# Patient Record
Sex: Female | Born: 1990 | Hispanic: No | Marital: Single | Smoking: Never smoker
Health system: Southern US, Community
[De-identification: ages and names within clinical notes are randomized; demographics above are authoritative.]

## PROBLEM LIST (undated history)

## (undated) ENCOUNTER — Inpatient Hospital Stay (HOSPITAL_COMMUNITY)

## (undated) ENCOUNTER — Inpatient Hospital Stay (HOSPITAL_COMMUNITY): Payer: Self-pay

## (undated) DIAGNOSIS — F319 Bipolar disorder, unspecified: Secondary | ICD-10-CM

## (undated) DIAGNOSIS — T8859XA Other complications of anesthesia, initial encounter: Secondary | ICD-10-CM

## (undated) DIAGNOSIS — K219 Gastro-esophageal reflux disease without esophagitis: Secondary | ICD-10-CM

## (undated) DIAGNOSIS — N83209 Unspecified ovarian cyst, unspecified side: Secondary | ICD-10-CM

## (undated) DIAGNOSIS — D649 Anemia, unspecified: Secondary | ICD-10-CM

## (undated) DIAGNOSIS — H3530 Unspecified macular degeneration: Secondary | ICD-10-CM

## (undated) DIAGNOSIS — F419 Anxiety disorder, unspecified: Secondary | ICD-10-CM

## (undated) DIAGNOSIS — R87629 Unspecified abnormal cytological findings in specimens from vagina: Secondary | ICD-10-CM

## (undated) DIAGNOSIS — B009 Herpesviral infection, unspecified: Secondary | ICD-10-CM

## (undated) DIAGNOSIS — K449 Diaphragmatic hernia without obstruction or gangrene: Secondary | ICD-10-CM

## (undated) HISTORY — PX: TYMPANOSTOMY TUBE PLACEMENT: SHX32

## (undated) HISTORY — DX: Unspecified macular degeneration: H35.30

## (undated) HISTORY — PX: ESOPHAGOGASTRODUODENOSCOPY ENDOSCOPY: SHX5814

## (undated) HISTORY — PX: WISDOM TOOTH EXTRACTION: SHX21

---

## 2015-03-11 ENCOUNTER — Encounter (HOSPITAL_COMMUNITY): Payer: Self-pay

## 2015-03-11 ENCOUNTER — Inpatient Hospital Stay (HOSPITAL_COMMUNITY)
Admission: AD | Admit: 2015-03-11 | Discharge: 2015-03-11 | Disposition: A | Discharge status: Home / Self Care | Payer: Federal, State, Local not specified - PPO | Source: Ambulatory Visit | Attending: Obstetrics and Gynecology | Admitting: Obstetrics and Gynecology

## 2015-03-11 DIAGNOSIS — L299 Pruritus, unspecified: Secondary | ICD-10-CM | POA: Diagnosis present

## 2015-03-11 DIAGNOSIS — L259 Unspecified contact dermatitis, unspecified cause: Secondary | ICD-10-CM | POA: Diagnosis not present

## 2015-03-11 HISTORY — DX: Bipolar disorder, unspecified: F31.9

## 2015-03-11 HISTORY — DX: Gastro-esophageal reflux disease without esophagitis: K21.9

## 2015-03-11 HISTORY — DX: Anemia, unspecified: D64.9

## 2015-03-11 HISTORY — DX: Herpesviral infection, unspecified: B00.9

## 2015-03-11 HISTORY — DX: Anxiety disorder, unspecified: F41.9

## 2015-03-11 LAB — URINALYSIS, ROUTINE W REFLEX MICROSCOPIC
BILIRUBIN URINE: NEGATIVE
GLUCOSE, UA: NEGATIVE mg/dL
Hgb urine dipstick: NEGATIVE
KETONES UR: NEGATIVE mg/dL
Leukocytes, UA: NEGATIVE
Nitrite: NEGATIVE
PH: 7.5 (ref 5.0–8.0)
Protein, ur: 30 mg/dL — AB
Specific Gravity, Urine: 1.025 (ref 1.005–1.030)
Urobilinogen, UA: 0.2 mg/dL (ref 0.0–1.0)

## 2015-03-11 LAB — URINE MICROSCOPIC-ADD ON

## 2015-03-11 LAB — POCT PREGNANCY, URINE: Preg Test, Ur: NEGATIVE

## 2015-03-11 NOTE — MAU Provider Note (Signed)
Chief Complaint: Pruritis   First Provider Initiated Contact with Patient 03/11/15 1131      SUBJECTIVE HPI: Megan Owens is a 24 y.o. G0 who presents to maternity admissions reporting generalized itching starting 1 week ago.  She has no rash but is itching all over her body.  She reports adding a new detergent to her laundry 3 weeks ago, no other changes in soaps/perfumes, etc. She saw Urgent Care this week and reported vaginal itching to them and was tested for STDs.  She reports they did not do much to evaluate her itching anywhere else but she told them she was concerned about STDs so they focused on that.  She tried Benadryl but it not relieve the itching.  She denies vaginal bleeding, urinary symptoms, h/a, dizziness, n/v, or fever/chills.    Past Medical History  Diagnosis Date  . Anxiety   . Anemia   . Acid reflux   . HSV infection   . Bipolar 1 disorder    Past Surgical History  Procedure Laterality Date  . Wisdom tooth extraction    . Tympanostomy tube placement     History   Social History  . Marital Status: Single    Spouse Name: N/A  . Number of Children: N/A  . Years of Education: N/A   Occupational History  . Not on file.   Social History Main Topics  . Smoking status: Never Smoker   . Smokeless tobacco: Never Used  . Alcohol Use: No  . Drug Use: No  . Sexual Activity: Yes    Birth Control/ Protection: Pill   Other Topics Concern  . Not on file   Social History Narrative  . No narrative on file   No current facility-administered medications on file prior to encounter.   No current outpatient prescriptions on file prior to encounter.   No Known Allergies  ROS: Pertinent items in HPI  OBJECTIVE Blood pressure 127/78, pulse 78, temperature 97.8 F (36.6 C), temperature source Oral, resp. rate 16, height 5\' 5"  (1.651 m), weight 62.143 kg (137 lb). GENERAL: Well-developed, well-nourished female in no acute distress.  HEENT:  Normocephalic HEART: normal rate RESP: normal effort ABDOMEN: Soft, non-tender EXTREMITIES: Nontender, no edema NEURO: Alert and oriented Skin:  No visible rash on extremities, trunk, back, face, or genitals.  Pruritis with some erythema from scratching on BUE and abdomen. Pelvic exam: Deferred  LAB RESULTS Results for orders placed or performed during the hospital encounter of 03/11/15 (from the past 24 hour(s))  Urinalysis, Routine w reflex microscopic     Status: Abnormal   Collection Time: 03/11/15 10:40 AM  Result Value Ref Range   Color, Urine YELLOW YELLOW   APPearance CLEAR CLEAR   Specific Gravity, Urine 1.025 1.005 - 1.030   pH 7.5 5.0 - 8.0   Glucose, UA NEGATIVE NEGATIVE mg/dL   Hgb urine dipstick NEGATIVE NEGATIVE   Bilirubin Urine NEGATIVE NEGATIVE   Ketones, ur NEGATIVE NEGATIVE mg/dL   Protein, ur 30 (A) NEGATIVE mg/dL   Urobilinogen, UA 0.2 0.0 - 1.0 mg/dL   Nitrite NEGATIVE NEGATIVE   Leukocytes, UA NEGATIVE NEGATIVE  Urine microscopic-add on     Status: None   Collection Time: 03/11/15 10:40 AM  Result Value Ref Range   Squamous Epithelial / LPF RARE RARE   Bacteria, UA RARE RARE   Urine-Other MUCOUS PRESENT   Pregnancy, urine POC     Status: None   Collection Time: 03/11/15 10:47 AM  Result Value Ref  Range   Preg Test, Ur NEGATIVE NEGATIVE    IMAGING No results found.  ASSESSMENT 1. Contact dermatitis   2. Generalized pruritus     PLAN Consult Dr Claiborne Billings Discharge home Continue PO Benadryl Referral to Dermatology. Pt reports she has a regular dermatologist for eczema and will contact him today. If symptoms worsen or persist prior to dermatology visit, recommend Urgent Care or [facility]   Medication List    TAKE these medications        BIOTIN PO  Take 1 tablet by mouth daily.     Dexlansoprazole 30 MG capsule  Take 30 mg by mouth daily.     glycopyrrolate 1 MG tablet  Commonly known as:  ROBINUL  Take 2 mg by mouth daily.      Lurasidone HCl 20 MG Tabs  Take 1 tablet by mouth daily.     Minocycline HCl 65 MG Tb24  Take 1 tablet by mouth daily.     Norethin Ace-Eth Estrad-FE 1-20 MG-MCG(24) Chew  Chew 1 tablet by mouth daily.     PROBIOTIC DAILY PO  Take 1 tablet by mouth daily.     valACYclovir 500 MG tablet  Commonly known as:  VALTREX  Take 500 mg by mouth daily.       Follow-up Information    Please follow up.   Why:  With dermatology if symptoms persist.  [facility] [phone]      Follow up with [facility].   Why:  If symptoms persist or worsen   Contact information:   [address] [place] [address]       Sharen Counter Certified Nurse-Midwife 03/11/2015  4:45 PM

## 2015-03-11 NOTE — Discharge Instructions (Signed)

## 2015-03-11 NOTE — MAU Note (Signed)
Pt states itching all over since Monday. Went to UC and had cultures obtained, which have been negative so far. Took benadryl and oatmeal bath and nothing helped. Has had white vaginal discharge that is normal, non-odorous.

## 2015-03-18 ENCOUNTER — Inpatient Hospital Stay (HOSPITAL_COMMUNITY)
Admission: AD | Admit: 2015-03-18 | Discharge: 2015-03-18 | Disposition: A | Discharge status: Home / Self Care | Payer: Federal, State, Local not specified - PPO | Source: Ambulatory Visit | Attending: Obstetrics and Gynecology | Admitting: Obstetrics and Gynecology

## 2015-03-18 ENCOUNTER — Encounter (HOSPITAL_COMMUNITY): Payer: Self-pay | Admitting: *Deleted

## 2015-03-18 DIAGNOSIS — R102 Pelvic and perineal pain: Secondary | ICD-10-CM | POA: Insufficient documentation

## 2015-03-18 DIAGNOSIS — B373 Candidiasis of vulva and vagina: Secondary | ICD-10-CM | POA: Diagnosis not present

## 2015-03-18 DIAGNOSIS — B3731 Acute candidiasis of vulva and vagina: Secondary | ICD-10-CM

## 2015-03-18 LAB — URINE MICROSCOPIC-ADD ON

## 2015-03-18 LAB — WET PREP, GENITAL
Clue Cells Wet Prep HPF POC: NONE SEEN
Trich, Wet Prep: NONE SEEN
WBC, Wet Prep HPF POC: NONE SEEN
Yeast Wet Prep HPF POC: NONE SEEN

## 2015-03-18 LAB — URINALYSIS, ROUTINE W REFLEX MICROSCOPIC
Bilirubin Urine: NEGATIVE
Glucose, UA: NEGATIVE mg/dL
KETONES UR: NEGATIVE mg/dL
LEUKOCYTES UA: NEGATIVE
NITRITE: NEGATIVE
Protein, ur: 100 mg/dL — AB
Specific Gravity, Urine: >1.03 — ABNORMAL HIGH (ref 1.005–1.030)
Urobilinogen, UA: 0.2 mg/dL (ref 0.0–1.0)
pH: 6 (ref 5.0–8.0)

## 2015-03-18 LAB — POCT PREGNANCY, URINE: Preg Test, Ur: NEGATIVE

## 2015-03-18 MED ORDER — FLUCONAZOLE 150 MG PO TABS: 150.0000 mg | ORAL_TABLET | Freq: Every day | ORAL | Status: DC

## 2015-03-18 NOTE — Discharge Instructions (Signed)
Abdominal Pain, Women °Abdominal (stomach, pelvic, or belly) pain can be caused by many things. It is important to tell your doctor: °· The location of the pain. °· Does it come and go or is it present all the time? °· Are there things that start the pain (eating certain foods, exercise)? °· Are there other symptoms associated with the pain (fever, nausea, vomiting, diarrhea)? °All of this is helpful to know when trying to find the cause of the pain. °CAUSES  °· Stomach: virus or bacteria infection, or ulcer. °· Intestine: appendicitis (inflamed appendix), regional ileitis (Crohn's disease), ulcerative colitis (inflamed colon), irritable bowel syndrome, diverticulitis (inflamed diverticulum of the colon), or cancer of the stomach or intestine. °· Gallbladder disease or stones in the gallbladder. °· Kidney disease, kidney stones, or infection. °· Pancreas infection or cancer. °· Fibromyalgia (pain disorder). °· Diseases of the female organs: °¨ Uterus: fibroid (non-cancerous) tumors or infection. °¨ Fallopian tubes: infection or tubal pregnancy. °¨ Ovary: cysts or tumors. °¨ Pelvic adhesions (scar tissue). °¨ Endometriosis (uterus lining tissue growing in the pelvis and on the pelvic organs). °¨ Pelvic congestion syndrome (female organs filling up with blood just before the menstrual period). °¨ Pain with the menstrual period. °¨ Pain with ovulation (producing an egg). °¨ Pain with an IUD (intrauterine device, birth control) in the uterus. °¨ Cancer of the female organs. °· Functional pain (pain not caused by a disease, may improve without treatment). °· Psychological pain. °· Depression. °DIAGNOSIS  °Your doctor will decide the seriousness of your pain by doing an examination. °· Blood tests. °· X-rays. °· Ultrasound. °· CT scan (computed tomography, special type of X-ray). °· MRI (magnetic resonance imaging). °· Cultures, for infection. °· Barium enema (dye inserted in the large intestine, to better view it with  X-rays). °· Colonoscopy (looking in intestine with a lighted tube). °· Laparoscopy (minor surgery, looking in abdomen with a lighted tube). °· Major abdominal exploratory surgery (looking in abdomen with a large incision). °TREATMENT  °The treatment will depend on the cause of the pain.  °· Many cases can be observed and treated at home. °· Over-the-counter medicines recommended by your caregiver. °· Prescription medicine. °· Antibiotics, for infection. °· Birth control pills, for painful periods or for ovulation pain. °· Hormone treatment, for endometriosis. °· Nerve blocking injections. °· Physical therapy. °· Antidepressants. °· Counseling with a psychologist or psychiatrist. °· Minor or major surgery. °HOME CARE INSTRUCTIONS  °· Do not take laxatives, unless directed by your caregiver. °· Take over-the-counter pain medicine only if ordered by your caregiver. Do not take aspirin because it can cause an upset stomach or bleeding. °· Try a clear liquid diet (broth or water) as ordered by your caregiver. Slowly move to a bland diet, as tolerated, if the pain is related to the stomach or intestine. °· Have a thermometer and take your temperature several times a day, and record it. °· Bed rest and sleep, if it helps the pain. °· Avoid sexual intercourse, if it causes pain. °· Avoid stressful situations. °· Keep your follow-up appointments and tests, as your caregiver orders. °· If the pain does not go away with medicine or surgery, you may try: °¨ Acupuncture. °¨ Relaxation exercises (yoga, meditation). °¨ Group therapy. °¨ Counseling. °SEEK MEDICAL CARE IF:  °· You notice certain foods cause stomach pain. °· Your home care treatment is not helping your pain. °· You need stronger pain medicine. °· You want your IUD removed. °· You feel faint or   lightheaded. °· You develop nausea and vomiting. °· You develop a rash. °· You are having side effects or an allergy to your medicine. °SEEK IMMEDIATE MEDICAL CARE IF:  °· Your  pain does not go away or gets worse. °· You have a fever. °· Your pain is felt only in portions of the abdomen. The right side could possibly be appendicitis. The left lower portion of the abdomen could be colitis or diverticulitis. °· You are passing blood in your stools (bright red or black tarry stools, with or without vomiting). °· You have blood in your urine. °· You develop chills, with or without a fever. °· You pass out. °MAKE SURE YOU:  °· Understand these instructions. °· Will watch your condition. °· Will get help right away if you are not doing well or get worse. °Document Released: 09/23/2007 Document Revised: 04/12/2014 Document Reviewed: 10/13/2009 °Exit[employer]® Patient Information ©2015 [employer]. This information is not intended to replace advice given to you by your health care provider. Make sure you discuss any questions you have with your health care provider. ° °

## 2015-03-18 NOTE — MAU Provider Note (Signed)
History     CSN: [id number]  Arrival date and time: 03/18/15 1526   First Provider Initiated Contact with Patient 03/18/15 1600      Chief Complaint  Patient presents with  . Pelvic Pain   HPI  [name] is a 24 y.o. G0P0000 who presents to MAU today with complaint of pelvic pain x 2 weeks. The patient was in the office yesterday and had a pelvic exam. She states that no one told her the results of the swab that was taken. Patient also states that she was seen at Urgent Care last week for the same complaint and had a negative GC/Chlamydia then. She states pain now is 5/10, cramping in the lower abdomen. She also complains of nausea without vomiting, diarrhea or constipation. She denies vaginal discharge, bleeding or fever. She states LMP 03/10/15 was normal. She endorses light regular periods since starting OCPs.   OB History    Gravida Para Term Preterm AB TAB SAB Ectopic Multiple Living   0 0 0 0 0 0 0 0 0 0       Past Medical History  Diagnosis Date  . Anxiety   . Anemia   . Acid reflux   . HSV infection   . Bipolar 1 disorder     Past Surgical History  Procedure Laterality Date  . Wisdom tooth extraction    . Tympanostomy tube placement      History reviewed. No pertinent family history.  History  Substance Use Topics  . Smoking status: Never Smoker   . Smokeless tobacco: Never Used  . Alcohol Use: No    Allergies: No Known Allergies  Prescriptions prior to admission  Medication Sig Dispense Refill Last Dose  . BIOTIN PO Take 1 tablet by mouth daily.   03/18/2015 at Unknown time  . Dexlansoprazole 30 MG capsule Take 30 mg by mouth daily.   03/18/2015 at Unknown time  . diphenhydrAMINE (BENADRYL) 25 MG tablet Take 25 mg by mouth every 6 (six) hours as needed for itching.   Past Week at Unknown time  . glycopyrrolate (ROBINUL) 1 MG tablet Take 2 mg by mouth daily.   Past Week at Unknown time  . Lurasidone HCl 20 MG TABS Take 1 tablet by mouth daily.    Past Week at Unknown time  . Minocycline HCl 65 MG TB24 Take 1 tablet by mouth daily.   Past Week at Unknown time  . Norethin Ace-Eth Estrad-FE 1-20 MG-MCG(24) CHEW Chew 1 tablet by mouth daily.   03/18/2015 at Unknown time  . Probiotic Product (PROBIOTIC DAILY PO) Take 1 tablet by mouth daily.   03/17/2015 at Unknown time  . valACYclovir (VALTREX) 500 MG tablet Take 500 mg by mouth daily.   03/18/2015 at Unknown time  . Vitamin D, Ergocalciferol, (DRISDOL) 50000 UNITS CAPS capsule Take 50,000 Units by mouth every 7 (seven) days.   Past Month at Unknown time    Review of Systems  Constitutional: Negative for fever and malaise/fatigue.  Gastrointestinal: Positive for nausea and abdominal pain. Negative for vomiting, diarrhea and constipation.  Genitourinary: Negative for dysuria, urgency and frequency.       Neg - vaginal bleeding, discharge   Physical Exam   Blood pressure 122/80, pulse 85, temperature 99.1 F (37.3 C), resp. rate 18, last menstrual period 03/10/2015.  Physical Exam  Constitutional: She is oriented to person, place, and time. She appears well-developed and well-nourished. No distress.  HENT:  Head: Normocephalic.  Cardiovascular: Normal rate.  Respiratory: Effort normal.  GI: Soft. Bowel sounds are normal. She exhibits no distension and no mass. There is tenderness (mild tenderness to palpation of the RLQ). There is no rebound and no guarding.  Genitourinary: Uterus is not enlarged and not tender. Right adnexum displays tenderness (mild). Right adnexum displays no mass. Left adnexum displays tenderness (mild). Left adnexum displays no mass. No bleeding in the vagina. Vaginal discharge (small amount of clumpy, white discharge noted) found.  Neurological: She is alert and oriented to person, place, and time.  Skin: Skin is warm and dry. No erythema.  Psychiatric: She has a normal mood and affect.   Results for orders placed or performed during the hospital encounter of  03/18/15 (from the past 24 hour(s))  Urinalysis, Routine w reflex microscopic     Status: Abnormal   Collection Time: 03/18/15  3:40 PM  Result Value Ref Range   Color, Urine YELLOW YELLOW   APPearance CLEAR CLEAR   Specific Gravity, Urine >1.030 (H) 1.005 - 1.030   pH 6.0 5.0 - 8.0   Glucose, UA NEGATIVE NEGATIVE mg/dL   Hgb urine dipstick SMALL (A) NEGATIVE   Bilirubin Urine NEGATIVE NEGATIVE   Ketones, ur NEGATIVE NEGATIVE mg/dL   Protein, ur 161 (A) NEGATIVE mg/dL   Urobilinogen, UA 0.2 0.0 - 1.0 mg/dL   Nitrite NEGATIVE NEGATIVE   Leukocytes, UA NEGATIVE NEGATIVE  Urine microscopic-add on     Status: Abnormal   Collection Time: 03/18/15  3:40 PM  Result Value Ref Range   Squamous Epithelial / LPF FEW (A) RARE   RBC / HPF 0-2 <3 RBC/hpf  Pregnancy, urine POC     Status: None   Collection Time: 03/18/15  4:12 PM  Result Value Ref Range   Preg Test, Ur NEGATIVE NEGATIVE  Wet prep, genital     Status: None   Collection Time: 03/18/15  5:20 PM  Result Value Ref Range   Yeast Wet Prep HPF POC NONE SEEN NONE SEEN   Trich, Wet Prep NONE SEEN NONE SEEN   Clue Cells Wet Prep HPF POC NONE SEEN NONE SEEN   WBC, Wet Prep HPF POC NONE SEEN NONE SEEN    MAU Course  Procedures None  MDM UPT  - negative UA and wet prep today Discussed patient with Dr. Henderson Cloud. She agrees with plan to perform pelvic exam and have patient follow-up in the office for [place] if needed Given minimal pain on exam and no masses noted will defer [place] to be performed outpatient  Assessment and Plan  A: Pelvic pain Yeast vaginitis, clinical  P: Discharge home Rx for Diflucan sent to patient's pharmacy Patient advised to take Ibuprofen PRN for pain Patient encouraged to follow-up in the office as scheduled for pelvic pain Patient may return to MAU as needed or if her condition were to change or worsen   Marny Lowenstein, PA-C  03/18/2015, 6:01 PM

## 2015-03-18 NOTE — MAU Note (Signed)
Pt reports having lower abd/pelvic pain sharp shooting pain x1-2 weeks. Went to ob yesterday and they schedualed her for an u/s in 2 weeks. Pt said she could not wait that long .

## 2015-03-31 ENCOUNTER — Other Ambulatory Visit: Payer: Self-pay | Admitting: Obstetrics and Gynecology

## 2015-03-31 DIAGNOSIS — R102 Pelvic and perineal pain: Secondary | ICD-10-CM

## 2015-04-04 ENCOUNTER — Ambulatory Visit
Admission: RE | Admit: 2015-04-04 | Discharge: 2015-04-04 | Disposition: A | Discharge status: Home / Self Care | Payer: Federal, State, Local not specified - PPO | Source: Ambulatory Visit | Attending: Obstetrics and Gynecology | Admitting: Obstetrics and Gynecology

## 2015-04-04 DIAGNOSIS — R102 Pelvic and perineal pain: Secondary | ICD-10-CM

## 2015-06-13 ENCOUNTER — Encounter (HOSPITAL_COMMUNITY): Payer: Self-pay | Admitting: Emergency Medicine

## 2015-06-13 ENCOUNTER — Emergency Department (HOSPITAL_COMMUNITY)
Admission: EM | Admit: 2015-06-13 | Discharge: 2015-06-13 | Disposition: A | Discharge status: Home / Self Care | Payer: Federal, State, Local not specified - PPO | Attending: Emergency Medicine | Admitting: Emergency Medicine

## 2015-06-13 DIAGNOSIS — M79601 Pain in right arm: Secondary | ICD-10-CM | POA: Diagnosis not present

## 2015-06-13 DIAGNOSIS — Z8619 Personal history of other infectious and parasitic diseases: Secondary | ICD-10-CM | POA: Diagnosis not present

## 2015-06-13 DIAGNOSIS — K219 Gastro-esophageal reflux disease without esophagitis: Secondary | ICD-10-CM | POA: Insufficient documentation

## 2015-06-13 DIAGNOSIS — Z79899 Other long term (current) drug therapy: Secondary | ICD-10-CM | POA: Diagnosis not present

## 2015-06-13 DIAGNOSIS — Z8659 Personal history of other mental and behavioral disorders: Secondary | ICD-10-CM | POA: Insufficient documentation

## 2015-06-13 DIAGNOSIS — Z793 Long term (current) use of hormonal contraceptives: Secondary | ICD-10-CM | POA: Diagnosis not present

## 2015-06-13 DIAGNOSIS — Z862 Personal history of diseases of the blood and blood-forming organs and certain disorders involving the immune mechanism: Secondary | ICD-10-CM | POA: Insufficient documentation

## 2015-06-13 DIAGNOSIS — R200 Anesthesia of skin: Secondary | ICD-10-CM | POA: Diagnosis present

## 2015-06-13 MED ORDER — IBUPROFEN 600 MG PO TABS: 600.0000 mg | ORAL_TABLET | Freq: Four times a day (QID) | ORAL | Status: DC | PRN

## 2015-06-13 NOTE — Discharge Instructions (Signed)
You were evaluated in the ED today for your right arm pain. There is not appear to be an emergent cause for your symptoms at this time. Please take your anti-inflammatory's as directed for your discomfort. Follow-up with orthopedics if your symptoms do not improve. Return to ED for new or worsening symptoms.

## 2015-06-13 NOTE — ED Notes (Addendum)
Pt reports numbness and ache to right arm (elbow to hand) onset 2100 yesterday; pt denies injury.

## 2015-06-13 NOTE — ED Provider Notes (Signed)
CSN: [id number]     Arrival date & time 06/13/15  0844 History   First MD Initiated Contact with Patient 06/13/15 [phone]     Chief Complaint  Patient presents with  . Numbness     (Consider location/radiation/quality/duration/timing/severity/associated sxs/prior Treatment) HPI Megan Owens is a 24 y.o. female who comes in for evaluation of right arm pain and numbness. Patient states yesterday at 9:00 PM she began to notice a numbness and right arm ache that she rates as a 7/10. She denies any trauma or injury. Denies any awkward sleeping positions or movements. She has not tried anything to improve her symptoms. Nothing makes it better or worse. Denies any neck pain, shoulder pain, elbow pain, decreased range of motion, fevers or chills. No other activity or modifying factors.  Past Medical History  Diagnosis Date  . Anxiety   . Anemia   . Acid reflux   . HSV infection   . Bipolar 1 disorder    Past Surgical History  Procedure Laterality Date  . Wisdom tooth extraction    . Tympanostomy tube placement     No family history on file. History  Substance Use Topics  . Smoking status: Never Smoker   . Smokeless tobacco: Never Used  . Alcohol Use: No   OB History    Gravida Para Term Preterm AB TAB SAB Ectopic Multiple Living       Review of Systems A 10 point review of systems was completed and was negative except for pertinent positives and negatives as mentioned in the history of present illness     Allergies  Review of patient's allergies indicates no known allergies.  Home Medications   Prior to Admission medications   Medication Sig Start Date End Date Taking? Authorizing Provider  Carboxymethylcellul-Glycerin (OPTIVE) 0.5-0.9 % SOLN Place 1-2 drops into both eyes daily as needed (For dry eyes.).   Yes Historical Provider, MD  Dexlansoprazole 30 MG capsule Take 30 mg by mouth daily.   Yes Historical Provider, MD  Hydrocodone-Acetaminophen  (LORTAB PO) Take 1 tablet by mouth once.   Yes Historical Provider, MD  hyoscyamine (LEVSIN SL) 0.125 MG SL tablet Place 0.125 mg under the tongue every 4 (four) hours as needed for cramping.  03/30/15  Yes Historical Provider, MD  levonorgestrel-ethinyl estradiol (LUTERA) 0.1-20 MG-MCG tablet Take 1 tablet by mouth daily.   Yes Historical Provider, MD  loratadine (CLARITIN) 10 MG tablet Take 10 mg by mouth daily as needed for allergies.   Yes Historical Provider, MD  ondansetron (ZOFRAN-ODT) 4 MG disintegrating tablet Take 4 mg by mouth every 8 (eight) hours as needed for nausea or vomiting.  06/09/15  Yes Historical Provider, MD  OVER THE COUNTER MEDICATION Take 1 tablet by mouth daily. Qilib for Hair Regrowth + Revitalizing   Yes Historical Provider, MD  valACYclovir (VALTREX) 500 MG tablet Take 500 mg by mouth daily.   Yes Historical Provider, MD  Vitamin D, Ergocalciferol, (DRISDOL) 50000 UNITS CAPS capsule Take 50,000 Units by mouth every 7 (seven) days.   Yes Historical Provider, MD  fluconazole (DIFLUCAN) 150 MG tablet Take 1 tablet (150 mg total) by mouth daily. Patient not taking: Reported on 06/13/2015 03/18/15   Marny Lowenstein, PA-C  ibuprofen (ADVIL,MOTRIN) 600 MG tablet Take 1 tablet (600 mg total) by mouth every 6 (six) hours as needed. 06/13/15   Joycie Peek, PA-C   BP 134/84 mmHg  Pulse 94  Temp(Src) 97.6 F (36.4 C) (Oral)  Resp 18  SpO2 100% Physical Exam  Constitutional: She is oriented to person, place, and time. She appears well-developed and well-nourished.  HENT:  Head: Normocephalic and atraumatic.  Mouth/Throat: Oropharynx is clear and moist.  Eyes: Conjunctivae are normal. Pupils are equal, round, and reactive to light. Right eye exhibits no discharge. Left eye exhibits no discharge. No scleral icterus.  Neck: Neck supple.  Full active range of motion of cervical, thoracic and lumbar spine. No midline bony tenderness. No evidence of vertebral fracture or compromise.   Cardiovascular: Normal rate, regular rhythm and normal heart sounds.   Pulmonary/Chest: Effort normal and breath sounds normal. No respiratory distress. She has no wheezes. She has no rales.  Abdominal: Soft. There is no tenderness.  Musculoskeletal: She exhibits no tenderness.  Patient maintains full active range of motion of all extremities. No overt erythema or warmth noted to any part of skin or joints. No focal tenderness.  Neurological: She is alert and oriented to person, place, and time.  Cranial Nerves II-XII grossly intact. Grip strength slightly decreased and right hand versus left. Sensation intact to light touch. Completes cardinal hand movements without difficulty. Phalen's maneuver causes pain without any paresthesias.  Skin: Skin is warm and dry. No rash noted.  Psychiatric: She has a normal mood and affect.  Nursing note and vitals reviewed.   ED Course  Procedures (including critical care time) Labs Review Labs Reviewed - No data to display  Imaging Review No results found.   EKG Interpretation None     Filed Vitals:   06/13/15 0902  BP: 134/84  Pulse: 94  Temp: 97.6 F (36.4 C)  Resp: 18    MDM  Vitals stable - WNL -afebrile Pt resting comfortably in ED. PE--neuro exam not concerning for emergent pathology. Maintains full range of motion. Distal pulses intact. No evidence of vascular compromise. Sensation intact to light touch.  DDX--patient with nonspecific radiculopathy, possibly radial nerve palsy, with no emergent cause. Will DC with anti-inflammatory and instructions for stretching and continued range of motion at home. Referral given to orthopedics if symptoms do not improve. Return to ED for worsening symptoms.  I discussed all relevant lab findings and imaging results with pt and they verbalized understanding. Discussed f/u with PCP within 48 hrs and return precautions, pt very amenable to plan.  Final diagnoses:  Right arm pain         Joycie PeekBenjamin Emileo Semel, PA-C 06/13/15 1634  Raeford RazorStephen Kohut, MD 06/14/15 [phone]

## 2015-06-14 ENCOUNTER — Other Ambulatory Visit: Payer: Self-pay | Admitting: Gastroenterology

## 2015-06-14 DIAGNOSIS — R112 Nausea with vomiting, unspecified: Secondary | ICD-10-CM

## 2015-07-12 ENCOUNTER — Ambulatory Visit (HOSPITAL_COMMUNITY)
Admission: RE | Admit: 2015-07-12 | Discharge: 2015-07-12 | Disposition: A | Discharge status: Home / Self Care | Payer: Federal, State, Local not specified - PPO | Source: Ambulatory Visit | Attending: Gastroenterology | Admitting: Gastroenterology

## 2015-07-12 DIAGNOSIS — R112 Nausea with vomiting, unspecified: Secondary | ICD-10-CM | POA: Insufficient documentation

## 2015-07-12 DIAGNOSIS — R197 Diarrhea, unspecified: Secondary | ICD-10-CM | POA: Insufficient documentation

## 2015-07-12 MED ORDER — TECHNETIUM TC 99M MEBROFENIN IV KIT
5.1500 | PACK | Freq: Once | INTRAVENOUS | Status: AC | PRN
  Administered 2015-07-12: 5 via INTRAVENOUS

## 2015-11-15 IMAGING — CT CT ABD-PELV W/ CM
2 of 4 series · 16 of 46 positions shown, 18 images · IV contrast (APPLIED)
Comparison: None.

CLINICAL DATA: Lower abdominal and pelvic pain for 4 weeks. Nausea.

EXAM:
CT ABDOMEN AND PELVIS WITH CONTRAST
TECHNIQUE: Multidetector CT imaging of the abdomen and pelvis was performed
using the standard protocol following bolus administration of
intravenous contrast.
CONTRAST:  100 mL Omnipaque 300

[Series 2: abd pelvis 5.0 i41s 3 · axial · 0.65mm/px · z∈[+324,+734]mm · 13 of 90 slices shown, 15 images]
[im 4/90  soft-tissue]
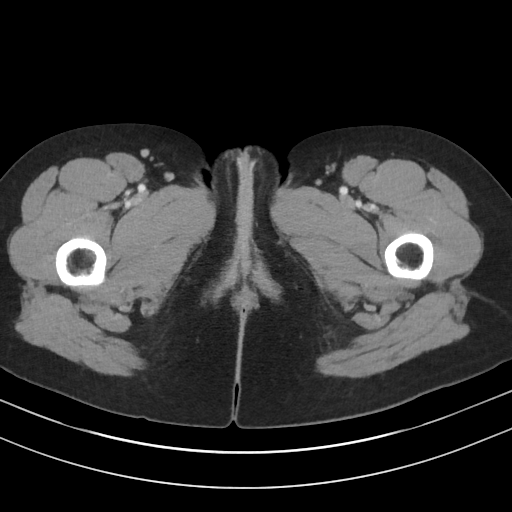
[im 4/90  bone]
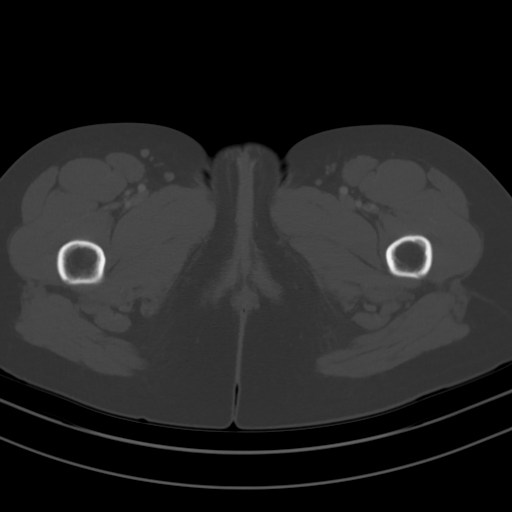
[im 11/90  soft-tissue]
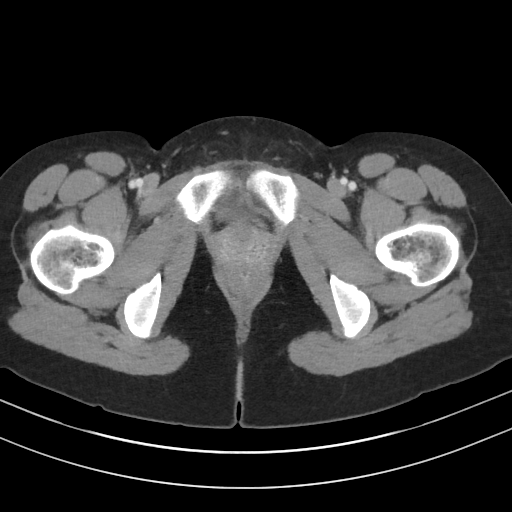
[im 18/90  soft-tissue]
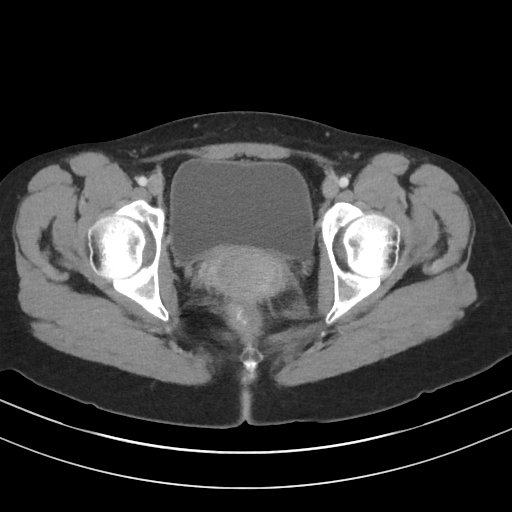
[im 24/90  soft-tissue]
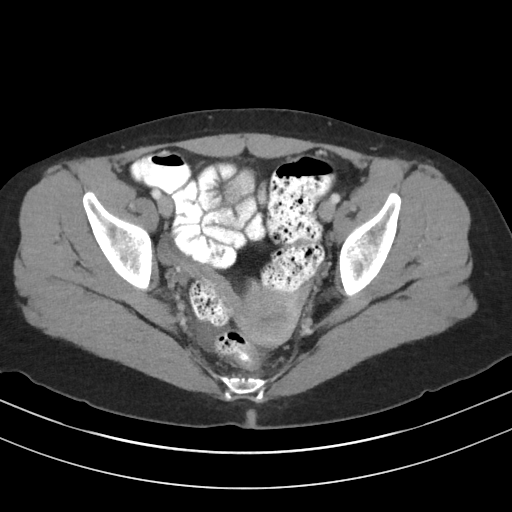
[im 31/90  soft-tissue]
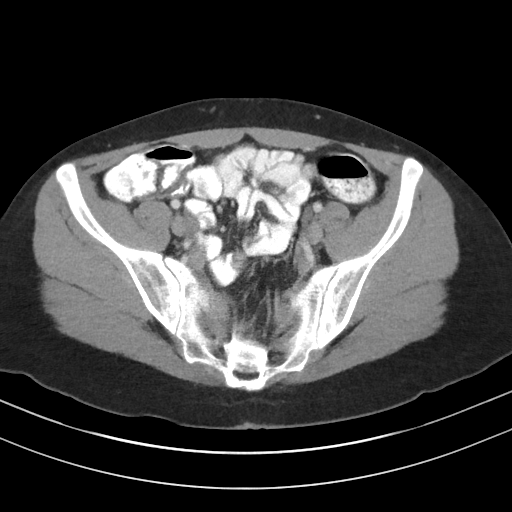
[im 38/90  soft-tissue]
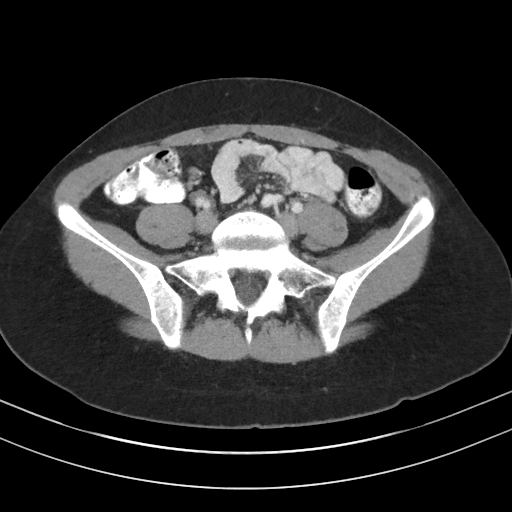
[im 45/90  soft-tissue]
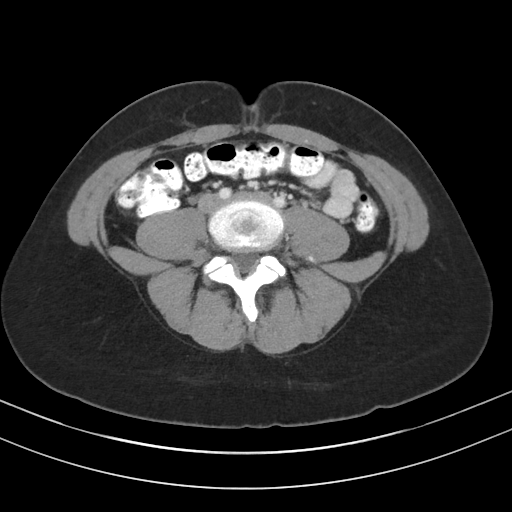
[im 52/90  soft-tissue]
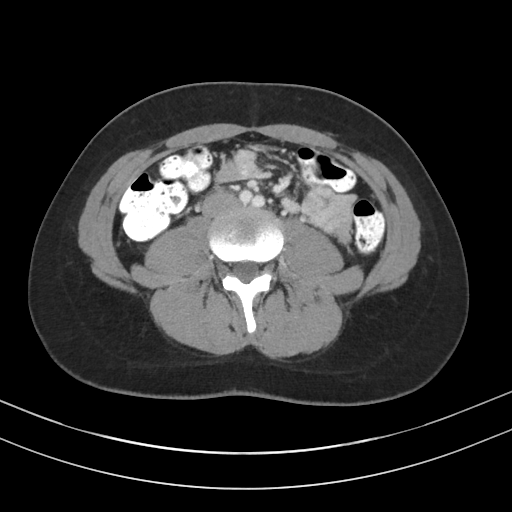
[im 59/90  soft-tissue]
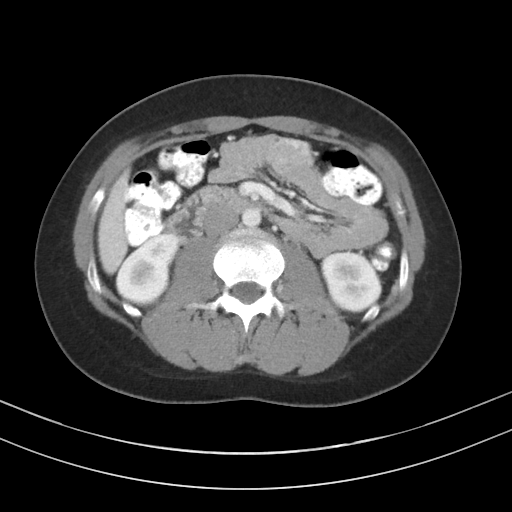
[im 59/90  bone]
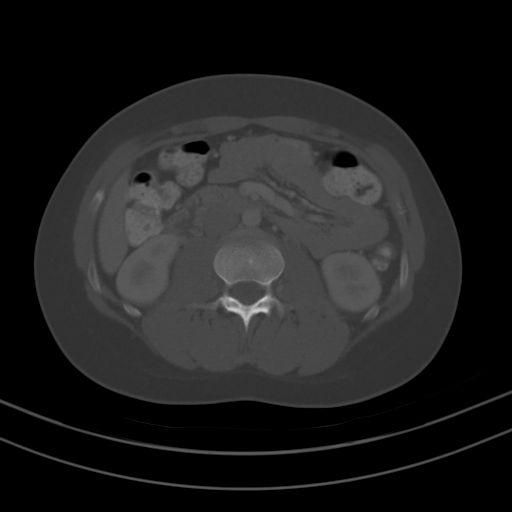
[im 66/90  soft-tissue]
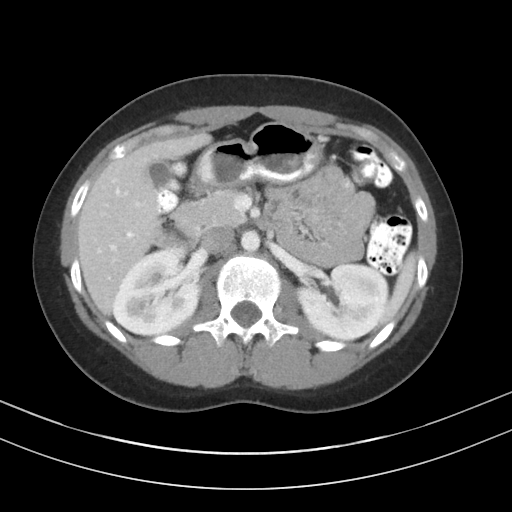
[im 72/90  soft-tissue]
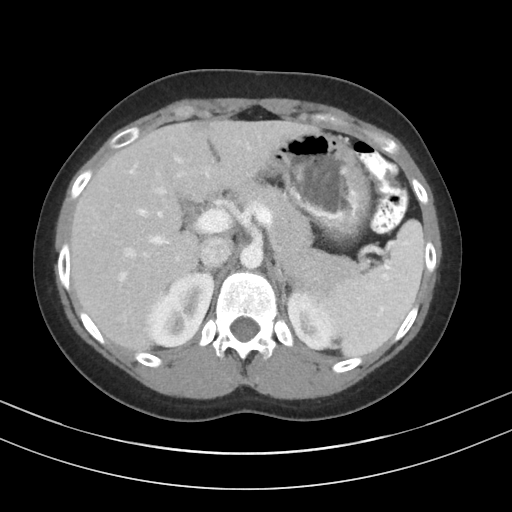
[im 79/90  soft-tissue]
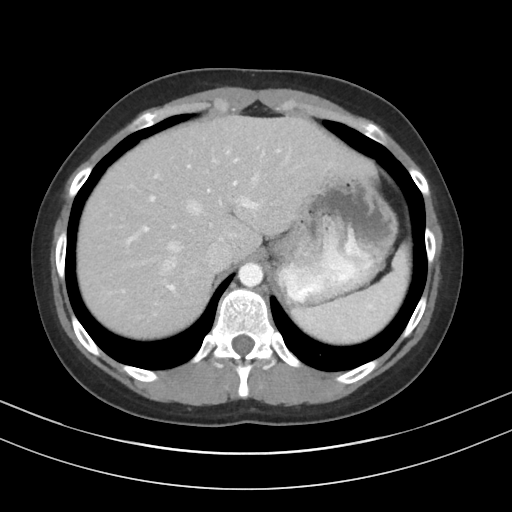
[im 86/90  soft-tissue]
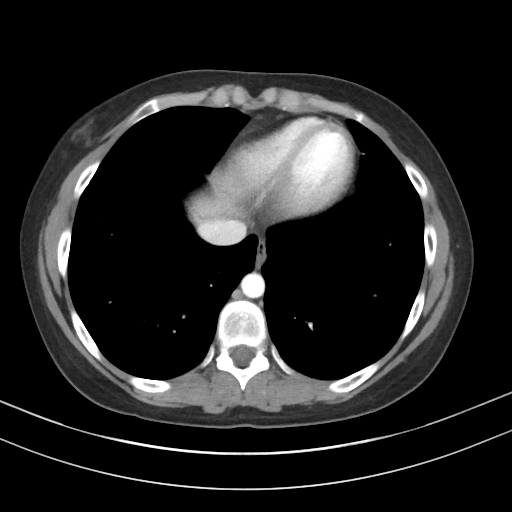

[Series 3: abd pelvis 2.0 spo cor · coronal · 0.69mm/px · 3 of 110 slices shown]
[im 37/110  soft-tissue]
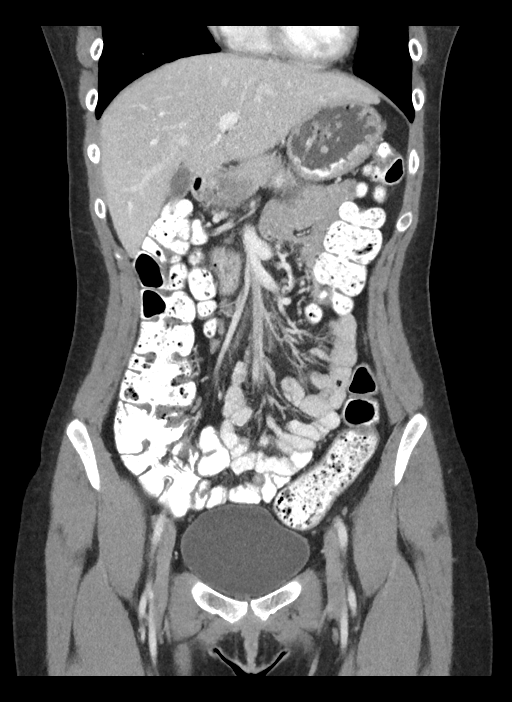
[im 49/110  soft-tissue]
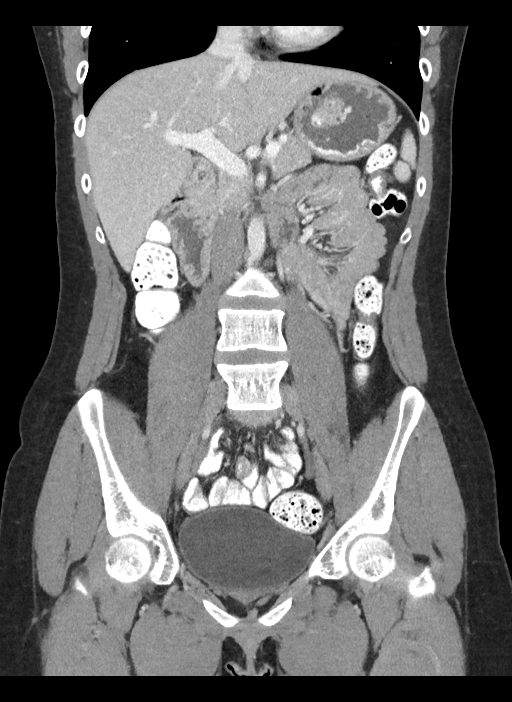
[im 61/110  soft-tissue]
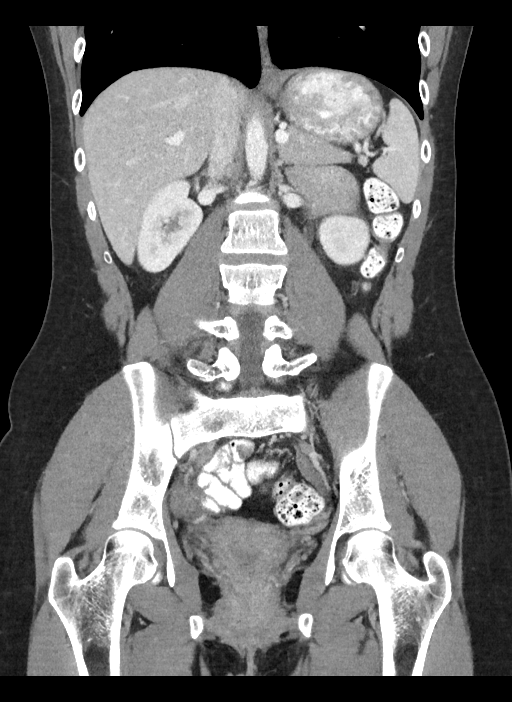

[16 of 46 positions shown; findings below may reference images not displayed]

FINDINGS: Lower Chest:  Unremarkable.

Hepatobiliary: No masses or other significant abnormality
identified. Gallbladder is unremarkable.

Pancreas: No mass, inflammatory changes, or other significant
abnormality identified.

Spleen:  Within normal limits in size and appearance.

Adrenals:  No masses identified.

Kidneys/Urinary Tract:  No evidence of masses or hydronephrosis.

Stomach/Bowel/Peritoneum: No evidence of wall thickening, mass, or
obstruction.

Vascular/Lymphatic: No pathologically enlarged lymph nodes
identified. No other significant abnormality visualized.

Reproductive: No mass or other significant abnormality identified.
Tiny amount of free fluid in pelvic cul-de-sac is likely
physiologic.

Other:  None.

Musculoskeletal:  No suspicious bone lesions identified.
IMPRESSION: Negative. No acute findings or other significant abnormality
identified within the abdomen or pelvis.

## 2016-08-29 DIAGNOSIS — F39 Unspecified mood [affective] disorder: Secondary | ICD-10-CM | POA: Diagnosis not present

## 2016-08-29 DIAGNOSIS — F419 Anxiety disorder, unspecified: Secondary | ICD-10-CM | POA: Diagnosis not present

## 2016-08-29 DIAGNOSIS — F3181 Bipolar II disorder: Secondary | ICD-10-CM | POA: Diagnosis not present

## 2016-09-06 DIAGNOSIS — A630 Anogenital (venereal) warts: Secondary | ICD-10-CM | POA: Diagnosis not present

## 2016-09-11 DIAGNOSIS — M546 Pain in thoracic spine: Secondary | ICD-10-CM | POA: Diagnosis not present

## 2016-09-17 DIAGNOSIS — F39 Unspecified mood [affective] disorder: Secondary | ICD-10-CM | POA: Diagnosis not present

## 2016-09-17 DIAGNOSIS — F901 Attention-deficit hyperactivity disorder, predominantly hyperactive type: Secondary | ICD-10-CM | POA: Diagnosis not present

## 2016-09-17 DIAGNOSIS — F419 Anxiety disorder, unspecified: Secondary | ICD-10-CM | POA: Diagnosis not present

## 2016-09-17 DIAGNOSIS — F428 Other obsessive-compulsive disorder: Secondary | ICD-10-CM | POA: Diagnosis not present

## 2016-09-26 DIAGNOSIS — K219 Gastro-esophageal reflux disease without esophagitis: Secondary | ICD-10-CM | POA: Diagnosis not present

## 2016-09-26 DIAGNOSIS — F901 Attention-deficit hyperactivity disorder, predominantly hyperactive type: Secondary | ICD-10-CM | POA: Diagnosis not present

## 2016-09-26 DIAGNOSIS — F428 Other obsessive-compulsive disorder: Secondary | ICD-10-CM | POA: Diagnosis not present

## 2016-09-26 DIAGNOSIS — Z79899 Other long term (current) drug therapy: Secondary | ICD-10-CM | POA: Diagnosis not present

## 2016-09-26 DIAGNOSIS — F419 Anxiety disorder, unspecified: Secondary | ICD-10-CM | POA: Diagnosis not present

## 2016-09-26 DIAGNOSIS — F39 Unspecified mood [affective] disorder: Secondary | ICD-10-CM | POA: Diagnosis not present

## 2016-09-28 DIAGNOSIS — N898 Other specified noninflammatory disorders of vagina: Secondary | ICD-10-CM | POA: Diagnosis not present

## 2016-09-28 DIAGNOSIS — B373 Candidiasis of vulva and vagina: Secondary | ICD-10-CM | POA: Diagnosis not present

## 2016-10-02 DIAGNOSIS — Z136 Encounter for screening for cardiovascular disorders: Secondary | ICD-10-CM | POA: Diagnosis not present

## 2016-10-02 DIAGNOSIS — Z23 Encounter for immunization: Secondary | ICD-10-CM | POA: Diagnosis not present

## 2016-10-02 DIAGNOSIS — E162 Hypoglycemia, unspecified: Secondary | ICD-10-CM | POA: Diagnosis not present

## 2016-10-02 DIAGNOSIS — Z6823 Body mass index (BMI) 23.0-23.9, adult: Secondary | ICD-10-CM | POA: Diagnosis not present

## 2016-10-02 DIAGNOSIS — Z79899 Other long term (current) drug therapy: Secondary | ICD-10-CM | POA: Diagnosis not present

## 2016-10-02 DIAGNOSIS — F319 Bipolar disorder, unspecified: Secondary | ICD-10-CM | POA: Diagnosis not present

## 2016-10-03 DIAGNOSIS — A5400 Gonococcal infection of lower genitourinary tract, unspecified: Secondary | ICD-10-CM | POA: Diagnosis not present

## 2016-10-03 DIAGNOSIS — A7489 Other chlamydial diseases: Secondary | ICD-10-CM | POA: Diagnosis not present

## 2016-10-09 DIAGNOSIS — M546 Pain in thoracic spine: Secondary | ICD-10-CM | POA: Diagnosis not present

## 2016-10-11 DIAGNOSIS — N926 Irregular menstruation, unspecified: Secondary | ICD-10-CM | POA: Diagnosis not present

## 2016-10-11 DIAGNOSIS — Z113 Encounter for screening for infections with a predominantly sexual mode of transmission: Secondary | ICD-10-CM | POA: Diagnosis not present

## 2016-10-11 DIAGNOSIS — Z3040 Encounter for surveillance of contraceptives, unspecified: Secondary | ICD-10-CM | POA: Diagnosis not present

## 2016-10-12 DIAGNOSIS — Z3040 Encounter for surveillance of contraceptives, unspecified: Secondary | ICD-10-CM | POA: Diagnosis not present

## 2016-10-12 DIAGNOSIS — Z309 Encounter for contraceptive management, unspecified: Secondary | ICD-10-CM | POA: Diagnosis not present

## 2016-10-18 DIAGNOSIS — A549 Gonococcal infection, unspecified: Secondary | ICD-10-CM | POA: Diagnosis not present

## 2016-10-18 DIAGNOSIS — A749 Chlamydial infection, unspecified: Secondary | ICD-10-CM | POA: Diagnosis not present

## 2016-10-18 DIAGNOSIS — Z6821 Body mass index (BMI) 21.0-21.9, adult: Secondary | ICD-10-CM | POA: Diagnosis not present

## 2016-10-18 DIAGNOSIS — N760 Acute vaginitis: Secondary | ICD-10-CM | POA: Diagnosis not present

## 2016-10-22 DIAGNOSIS — F39 Unspecified mood [affective] disorder: Secondary | ICD-10-CM | POA: Diagnosis not present

## 2016-10-22 DIAGNOSIS — F3181 Bipolar II disorder: Secondary | ICD-10-CM | POA: Diagnosis not present

## 2016-10-22 DIAGNOSIS — F419 Anxiety disorder, unspecified: Secondary | ICD-10-CM | POA: Diagnosis not present

## 2016-11-14 DIAGNOSIS — F419 Anxiety disorder, unspecified: Secondary | ICD-10-CM | POA: Diagnosis not present

## 2016-11-14 DIAGNOSIS — F3181 Bipolar II disorder: Secondary | ICD-10-CM | POA: Diagnosis not present

## 2016-11-14 DIAGNOSIS — F39 Unspecified mood [affective] disorder: Secondary | ICD-10-CM | POA: Diagnosis not present

## 2016-12-06 DIAGNOSIS — Z681 Body mass index (BMI) 19 or less, adult: Secondary | ICD-10-CM | POA: Diagnosis not present

## 2016-12-06 DIAGNOSIS — J069 Acute upper respiratory infection, unspecified: Secondary | ICD-10-CM | POA: Diagnosis not present

## 2016-12-06 DIAGNOSIS — J029 Acute pharyngitis, unspecified: Secondary | ICD-10-CM | POA: Diagnosis not present

## 2016-12-06 DIAGNOSIS — R05 Cough: Secondary | ICD-10-CM | POA: Diagnosis not present

## 2016-12-06 DIAGNOSIS — R5383 Other fatigue: Secondary | ICD-10-CM | POA: Diagnosis not present

## 2016-12-06 DIAGNOSIS — R1110 Vomiting, unspecified: Secondary | ICD-10-CM | POA: Diagnosis not present

## 2016-12-13 DIAGNOSIS — F419 Anxiety disorder, unspecified: Secondary | ICD-10-CM | POA: Diagnosis not present

## 2016-12-13 DIAGNOSIS — F3181 Bipolar II disorder: Secondary | ICD-10-CM | POA: Diagnosis not present

## 2016-12-13 DIAGNOSIS — F900 Attention-deficit hyperactivity disorder, predominantly inattentive type: Secondary | ICD-10-CM | POA: Diagnosis not present

## 2016-12-30 DIAGNOSIS — B373 Candidiasis of vulva and vagina: Secondary | ICD-10-CM | POA: Diagnosis not present

## 2017-01-11 DIAGNOSIS — Z139 Encounter for screening, unspecified: Secondary | ICD-10-CM | POA: Diagnosis not present

## 2017-01-14 DIAGNOSIS — Z113 Encounter for screening for infections with a predominantly sexual mode of transmission: Secondary | ICD-10-CM | POA: Diagnosis not present

## 2017-01-14 DIAGNOSIS — Z6821 Body mass index (BMI) 21.0-21.9, adult: Secondary | ICD-10-CM | POA: Diagnosis not present

## 2017-01-14 DIAGNOSIS — N926 Irregular menstruation, unspecified: Secondary | ICD-10-CM | POA: Diagnosis not present

## 2017-01-14 DIAGNOSIS — N760 Acute vaginitis: Secondary | ICD-10-CM | POA: Diagnosis not present

## 2017-01-15 DIAGNOSIS — F419 Anxiety disorder, unspecified: Secondary | ICD-10-CM | POA: Diagnosis not present

## 2017-01-15 DIAGNOSIS — F3181 Bipolar II disorder: Secondary | ICD-10-CM | POA: Diagnosis not present

## 2017-01-23 DIAGNOSIS — Z136 Encounter for screening for cardiovascular disorders: Secondary | ICD-10-CM | POA: Diagnosis not present

## 2017-01-23 DIAGNOSIS — Z0000 Encounter for general adult medical examination without abnormal findings: Secondary | ICD-10-CM | POA: Diagnosis not present

## 2017-01-30 DIAGNOSIS — N898 Other specified noninflammatory disorders of vagina: Secondary | ICD-10-CM | POA: Diagnosis not present

## 2017-01-30 DIAGNOSIS — R87612 Low grade squamous intraepithelial lesion on cytologic smear of cervix (LGSIL): Secondary | ICD-10-CM | POA: Diagnosis not present

## 2017-01-30 DIAGNOSIS — B009 Herpesviral infection, unspecified: Secondary | ICD-10-CM | POA: Diagnosis not present

## 2017-01-30 DIAGNOSIS — R87619 Unspecified abnormal cytological findings in specimens from cervix uteri: Secondary | ICD-10-CM | POA: Diagnosis not present

## 2017-01-30 DIAGNOSIS — R87610 Atypical squamous cells of undetermined significance on cytologic smear of cervix (ASC-US): Secondary | ICD-10-CM | POA: Diagnosis not present

## 2017-01-30 DIAGNOSIS — Z01419 Encounter for gynecological examination (general) (routine) without abnormal findings: Secondary | ICD-10-CM | POA: Diagnosis not present

## 2017-02-07 DIAGNOSIS — B009 Herpesviral infection, unspecified: Secondary | ICD-10-CM | POA: Diagnosis not present

## 2017-02-07 DIAGNOSIS — N898 Other specified noninflammatory disorders of vagina: Secondary | ICD-10-CM | POA: Diagnosis not present

## 2017-02-07 DIAGNOSIS — R300 Dysuria: Secondary | ICD-10-CM | POA: Diagnosis not present

## 2017-02-07 DIAGNOSIS — B373 Candidiasis of vulva and vagina: Secondary | ICD-10-CM | POA: Diagnosis not present

## 2017-02-08 DIAGNOSIS — Z6820 Body mass index (BMI) 20.0-20.9, adult: Secondary | ICD-10-CM | POA: Diagnosis not present

## 2017-02-08 DIAGNOSIS — Z0000 Encounter for general adult medical examination without abnormal findings: Secondary | ICD-10-CM | POA: Diagnosis not present

## 2017-02-20 DIAGNOSIS — H04123 Dry eye syndrome of bilateral lacrimal glands: Secondary | ICD-10-CM | POA: Diagnosis not present

## 2017-02-20 DIAGNOSIS — H40033 Anatomical narrow angle, bilateral: Secondary | ICD-10-CM | POA: Diagnosis not present

## 2017-02-21 ENCOUNTER — Emergency Department (HOSPITAL_COMMUNITY)
Admission: AD | Admit: 2017-02-21 | Discharge: 2017-02-21 | Disposition: A | Discharge status: Home / Self Care | Payer: No Typology Code available for payment source | Attending: Emergency Medicine | Admitting: Emergency Medicine

## 2017-02-21 ENCOUNTER — Encounter (HOSPITAL_COMMUNITY): Payer: Self-pay

## 2017-02-21 DIAGNOSIS — S70211A Abrasion, right hip, initial encounter: Secondary | ICD-10-CM | POA: Diagnosis not present

## 2017-02-21 DIAGNOSIS — Z79899 Other long term (current) drug therapy: Secondary | ICD-10-CM | POA: Insufficient documentation

## 2017-02-21 DIAGNOSIS — M25511 Pain in right shoulder: Secondary | ICD-10-CM | POA: Diagnosis not present

## 2017-02-21 DIAGNOSIS — S3991XA Unspecified injury of abdomen, initial encounter: Secondary | ICD-10-CM | POA: Diagnosis present

## 2017-02-21 DIAGNOSIS — S39011A Strain of muscle, fascia and tendon of abdomen, initial encounter: Secondary | ICD-10-CM | POA: Insufficient documentation

## 2017-02-21 DIAGNOSIS — Y92410 Unspecified street and highway as the place of occurrence of the external cause: Secondary | ICD-10-CM | POA: Insufficient documentation

## 2017-02-21 DIAGNOSIS — Y999 Unspecified external cause status: Secondary | ICD-10-CM | POA: Diagnosis not present

## 2017-02-21 DIAGNOSIS — S70311A Abrasion, right thigh, initial encounter: Secondary | ICD-10-CM | POA: Insufficient documentation

## 2017-02-21 DIAGNOSIS — T148XXA Other injury of unspecified body region, initial encounter: Secondary | ICD-10-CM

## 2017-02-21 DIAGNOSIS — Y939 Activity, unspecified: Secondary | ICD-10-CM | POA: Diagnosis not present

## 2017-02-21 MED ORDER — ACETAMINOPHEN 325 MG PO TABS
650.0000 mg | ORAL_TABLET | Freq: Once | ORAL | Status: AC
  Administered 2017-02-21: 650 mg via ORAL
  Filled 2017-02-21: qty 2

## 2017-02-21 NOTE — Discharge Instructions (Signed)
Please read attached information. If you experience any new or worsening signs or symptoms please return to the emergency room for evaluation. Please follow-up with your primary care provider or specialist as discussed.  °
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
## 2017-02-23 ENCOUNTER — Encounter (HOSPITAL_COMMUNITY): Payer: Self-pay | Admitting: Emergency Medicine

## 2017-02-23 ENCOUNTER — Emergency Department (HOSPITAL_COMMUNITY)
Admission: EM | Admit: 2017-02-23 | Discharge: 2017-02-23 | Disposition: A | Discharge status: Home / Self Care | Payer: Federal, State, Local not specified - PPO | Attending: Emergency Medicine | Admitting: Emergency Medicine

## 2017-02-23 DIAGNOSIS — R109 Unspecified abdominal pain: Secondary | ICD-10-CM | POA: Diagnosis present

## 2017-02-23 DIAGNOSIS — R1084 Generalized abdominal pain: Secondary | ICD-10-CM | POA: Diagnosis not present

## 2017-02-23 DIAGNOSIS — Y999 Unspecified external cause status: Secondary | ICD-10-CM | POA: Diagnosis not present

## 2017-02-23 DIAGNOSIS — Y939 Activity, unspecified: Secondary | ICD-10-CM | POA: Insufficient documentation

## 2017-02-23 DIAGNOSIS — R350 Frequency of micturition: Secondary | ICD-10-CM | POA: Diagnosis not present

## 2017-02-23 DIAGNOSIS — Y92410 Unspecified street and highway as the place of occurrence of the external cause: Secondary | ICD-10-CM | POA: Insufficient documentation

## 2017-02-23 LAB — COMPREHENSIVE METABOLIC PANEL
ALBUMIN: 4.2 g/dL (ref 3.5–5.0)
ALT: 14 U/L (ref 14–54)
ANION GAP: 4 — AB (ref 5–15)
AST: 17 U/L (ref 15–41)
Alkaline Phosphatase: 48 U/L (ref 38–126)
BUN: 11 mg/dL (ref 6–20)
CHLORIDE: 108 mmol/L (ref 101–111)
CO2: 26 mmol/L (ref 22–32)
Calcium: 9.3 mg/dL (ref 8.9–10.3)
Creatinine, Ser: 0.67 mg/dL (ref 0.44–1.00)
GFR calc Af Amer: >60 mL/min (ref >60)
GFR calc non Af Amer: >60 mL/min (ref >60)
Glucose, Bld: 91 mg/dL (ref 65–99)
POTASSIUM: 3.5 mmol/L (ref 3.5–5.1)
SODIUM: 138 mmol/L (ref 135–145)
Total Bilirubin: 0.7 mg/dL (ref 0.3–1.2)
Total Protein: 7 g/dL (ref 6.5–8.1)

## 2017-02-23 LAB — CBC
HEMATOCRIT: 36.3 % (ref 36.0–46.0)
HEMOGLOBIN: 12 g/dL (ref 12.0–15.0)
MCH: 28.6 pg (ref 26.0–34.0)
MCHC: 33.1 g/dL (ref 30.0–36.0)
MCV: 86.4 fL (ref 78.0–100.0)
Platelets: 157 10*3/uL (ref 150–400)
RBC: 4.2 MIL/uL (ref 3.87–5.11)
RDW: 15.2 % (ref 11.5–15.5)
WBC: 7.6 10*3/uL (ref 4.0–10.5)

## 2017-02-23 LAB — URINALYSIS, ROUTINE W REFLEX MICROSCOPIC
BILIRUBIN URINE: NEGATIVE
GLUCOSE, UA: NEGATIVE mg/dL
Hgb urine dipstick: NEGATIVE
Ketones, ur: NEGATIVE mg/dL
Leukocytes, UA: NEGATIVE
Nitrite: NEGATIVE
PH: 7 (ref 5.0–8.0)
Protein, ur: NEGATIVE mg/dL
SPECIFIC GRAVITY, URINE: 1.011 (ref 1.005–1.030)

## 2017-02-23 LAB — POC URINE PREG, ED: Preg Test, Ur: NEGATIVE

## 2017-02-23 LAB — LIPASE, BLOOD: LIPASE: 21 U/L (ref 11–51)
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
## 2017-02-23 NOTE — Discharge Instructions (Signed)
There were no findings, worrisome for intra-abdominal injuries, today.  It is safe to take Tylenol 650 mg every 4 hours for pain.  Follow up with your primary care doctor or return here, as needed for problems.

## 2017-02-23 NOTE — ED Notes (Signed)
Md at bedside

## 2017-02-26 DIAGNOSIS — F901 Attention-deficit hyperactivity disorder, predominantly hyperactive type: Secondary | ICD-10-CM | POA: Diagnosis not present

## 2017-02-26 DIAGNOSIS — Z6820 Body mass index (BMI) 20.0-20.9, adult: Secondary | ICD-10-CM | POA: Diagnosis not present

## 2017-02-26 DIAGNOSIS — F428 Other obsessive-compulsive disorder: Secondary | ICD-10-CM | POA: Diagnosis not present

## 2017-02-26 DIAGNOSIS — S301XXS Contusion of abdominal wall, sequela: Secondary | ICD-10-CM | POA: Diagnosis not present

## 2017-02-26 DIAGNOSIS — J309 Allergic rhinitis, unspecified: Secondary | ICD-10-CM | POA: Diagnosis not present

## 2017-02-26 DIAGNOSIS — Z578 Occupational exposure to other risk factors: Secondary | ICD-10-CM | POA: Diagnosis not present

## 2017-02-26 DIAGNOSIS — F419 Anxiety disorder, unspecified: Secondary | ICD-10-CM | POA: Diagnosis not present

## 2017-02-26 DIAGNOSIS — F39 Unspecified mood [affective] disorder: Secondary | ICD-10-CM | POA: Diagnosis not present

## 2017-03-26 DIAGNOSIS — K5900 Constipation, unspecified: Secondary | ICD-10-CM | POA: Diagnosis not present

## 2017-03-26 DIAGNOSIS — Z113 Encounter for screening for infections with a predominantly sexual mode of transmission: Secondary | ICD-10-CM | POA: Diagnosis not present

## 2017-03-26 DIAGNOSIS — Z118 Encounter for screening for other infectious and parasitic diseases: Secondary | ICD-10-CM | POA: Diagnosis not present

## 2017-03-26 DIAGNOSIS — S301XXS Contusion of abdominal wall, sequela: Secondary | ICD-10-CM | POA: Diagnosis not present

## 2017-03-26 DIAGNOSIS — Z6820 Body mass index (BMI) 20.0-20.9, adult: Secondary | ICD-10-CM | POA: Diagnosis not present

## 2017-03-26 DIAGNOSIS — F3181 Bipolar II disorder: Secondary | ICD-10-CM | POA: Diagnosis not present

## 2017-03-26 DIAGNOSIS — Z01419 Encounter for gynecological examination (general) (routine) without abnormal findings: Secondary | ICD-10-CM | POA: Diagnosis not present

## 2017-03-26 DIAGNOSIS — F419 Anxiety disorder, unspecified: Secondary | ICD-10-CM | POA: Diagnosis not present

## 2017-03-26 DIAGNOSIS — N760 Acute vaginitis: Secondary | ICD-10-CM | POA: Diagnosis not present

## 2017-04-17 DIAGNOSIS — F900 Attention-deficit hyperactivity disorder, predominantly inattentive type: Secondary | ICD-10-CM | POA: Diagnosis not present

## 2017-04-17 DIAGNOSIS — F3181 Bipolar II disorder: Secondary | ICD-10-CM | POA: Diagnosis not present

## 2017-05-06 DIAGNOSIS — J029 Acute pharyngitis, unspecified: Secondary | ICD-10-CM | POA: Diagnosis not present

## 2017-05-27 DIAGNOSIS — F3181 Bipolar II disorder: Secondary | ICD-10-CM | POA: Diagnosis not present

## 2017-05-27 DIAGNOSIS — F419 Anxiety disorder, unspecified: Secondary | ICD-10-CM | POA: Diagnosis not present

## 2017-06-18 DIAGNOSIS — H578 Other specified disorders of eye and adnexa: Secondary | ICD-10-CM | POA: Diagnosis not present

## 2017-06-20 DIAGNOSIS — F419 Anxiety disorder, unspecified: Secondary | ICD-10-CM | POA: Diagnosis not present

## 2017-06-20 DIAGNOSIS — N898 Other specified noninflammatory disorders of vagina: Secondary | ICD-10-CM | POA: Diagnosis not present

## 2017-06-20 DIAGNOSIS — F3181 Bipolar II disorder: Secondary | ICD-10-CM | POA: Diagnosis not present

## 2017-06-20 DIAGNOSIS — N909 Noninflammatory disorder of vulva and perineum, unspecified: Secondary | ICD-10-CM | POA: Diagnosis not present

## 2017-07-03 DIAGNOSIS — F3181 Bipolar II disorder: Secondary | ICD-10-CM | POA: Diagnosis not present

## 2017-07-03 DIAGNOSIS — F419 Anxiety disorder, unspecified: Secondary | ICD-10-CM | POA: Diagnosis not present

## 2017-07-10 DIAGNOSIS — F39 Unspecified mood [affective] disorder: Secondary | ICD-10-CM | POA: Diagnosis not present

## 2017-07-10 DIAGNOSIS — F419 Anxiety disorder, unspecified: Secondary | ICD-10-CM | POA: Diagnosis not present

## 2017-07-10 DIAGNOSIS — F901 Attention-deficit hyperactivity disorder, predominantly hyperactive type: Secondary | ICD-10-CM | POA: Diagnosis not present

## 2017-07-15 DIAGNOSIS — I889 Nonspecific lymphadenitis, unspecified: Secondary | ICD-10-CM | POA: Diagnosis not present

## 2017-07-15 DIAGNOSIS — J029 Acute pharyngitis, unspecified: Secondary | ICD-10-CM | POA: Diagnosis not present

## 2017-07-15 DIAGNOSIS — J358 Other chronic diseases of tonsils and adenoids: Secondary | ICD-10-CM | POA: Diagnosis not present

## 2017-07-15 DIAGNOSIS — J309 Allergic rhinitis, unspecified: Secondary | ICD-10-CM | POA: Diagnosis not present

## 2017-07-18 DIAGNOSIS — F901 Attention-deficit hyperactivity disorder, predominantly hyperactive type: Secondary | ICD-10-CM | POA: Diagnosis not present

## 2017-07-18 DIAGNOSIS — F419 Anxiety disorder, unspecified: Secondary | ICD-10-CM | POA: Diagnosis not present

## 2017-07-18 DIAGNOSIS — F39 Unspecified mood [affective] disorder: Secondary | ICD-10-CM | POA: Diagnosis not present

## 2017-07-22 ENCOUNTER — Encounter (HOSPITAL_COMMUNITY): Payer: Self-pay | Admitting: Emergency Medicine

## 2017-07-22 ENCOUNTER — Emergency Department (HOSPITAL_COMMUNITY)
Admission: EM | Admit: 2017-07-22 | Discharge: 2017-07-22 | Disposition: A | Discharge status: Home / Self Care | Payer: Federal, State, Local not specified - PPO | Attending: Emergency Medicine | Admitting: Emergency Medicine

## 2017-07-22 DIAGNOSIS — A084 Viral intestinal infection, unspecified: Secondary | ICD-10-CM | POA: Diagnosis not present

## 2017-07-22 DIAGNOSIS — Z79899 Other long term (current) drug therapy: Secondary | ICD-10-CM | POA: Insufficient documentation

## 2017-07-22 DIAGNOSIS — R112 Nausea with vomiting, unspecified: Secondary | ICD-10-CM | POA: Diagnosis present

## 2017-07-22 DIAGNOSIS — R197 Diarrhea, unspecified: Secondary | ICD-10-CM | POA: Diagnosis not present

## 2017-07-22 LAB — COMPREHENSIVE METABOLIC PANEL
ALT: 15 U/L (ref 14–54)
AST: 25 U/L (ref 15–41)
Albumin: 4.7 g/dL (ref 3.5–5.0)
Alkaline Phosphatase: 61 U/L (ref 38–126)
Anion gap: 13 (ref 5–15)
BUN: 18 mg/dL (ref 6–20)
CALCIUM: 9.8 mg/dL (ref 8.9–10.3)
CHLORIDE: 105 mmol/L (ref 101–111)
CO2: 18 mmol/L — ABNORMAL LOW (ref 22–32)
CREATININE: 0.87 mg/dL (ref 0.44–1.00)
GFR calc Af Amer: >60 mL/min (ref >60)
Glucose, Bld: 148 mg/dL — ABNORMAL HIGH (ref 65–99)
Potassium: 3.2 mmol/L — ABNORMAL LOW (ref 3.5–5.1)
Sodium: 136 mmol/L (ref 135–145)
TOTAL PROTEIN: 8 g/dL (ref 6.5–8.1)
Total Bilirubin: 0.6 mg/dL (ref 0.3–1.2)

## 2017-07-22 LAB — I-STAT BETA HCG BLOOD, ED (MC, WL, AP ONLY)

## 2017-07-22 LAB — CBC WITH DIFFERENTIAL/PLATELET
BASOS ABS: 0 10*3/uL (ref 0.0–0.1)
Basophils Relative: 0 %
EOS PCT: 2 %
Eosinophils Absolute: 0.3 10*3/uL (ref 0.0–0.7)
HCT: 42.5 % (ref 36.0–46.0)
HEMOGLOBIN: 15 g/dL (ref 12.0–15.0)
LYMPHS PCT: 20 %
Lymphs Abs: 3.1 10*3/uL (ref 0.7–4.0)
MCH: 31.4 pg (ref 26.0–34.0)
MCHC: 35.3 g/dL (ref 30.0–36.0)
MCV: 89.1 fL (ref 78.0–100.0)
Monocytes Absolute: 0.9 10*3/uL (ref 0.1–1.0)
Monocytes Relative: 6 %
NEUTROS ABS: 11.6 10*3/uL — AB (ref 1.7–7.7)
NEUTROS PCT: 73 %
PLATELETS: 176 10*3/uL (ref 150–400)
RBC: 4.77 MIL/uL (ref 3.87–5.11)
RDW: 14.3 % (ref 11.5–15.5)
WBC: 15.9 10*3/uL — ABNORMAL HIGH (ref 4.0–10.5)

## 2017-07-22 LAB — LIPASE, BLOOD: LIPASE: 43 U/L (ref 11–51)

## 2017-07-22 MED ORDER — POTASSIUM CHLORIDE CRYS ER 20 MEQ PO TBCR
40.0000 meq | EXTENDED_RELEASE_TABLET | Freq: Once | ORAL | Status: DC
  Filled 2017-07-22: qty 2

## 2017-07-22 MED ORDER — ONDANSETRON HCL 4 MG/2ML IJ SOLN
4.0000 mg | Freq: Once | INTRAMUSCULAR | Status: AC
  Administered 2017-07-22: 4 mg via INTRAVENOUS
  Filled 2017-07-22: qty 2

## 2017-07-22 MED ORDER — SODIUM CHLORIDE 0.9 % IV BOLUS (SEPSIS)
1000.0000 mL | Freq: Once | INTRAVENOUS | Status: AC
  Administered 2017-07-22: 1000 mL via INTRAVENOUS

## 2017-07-22 MED ORDER — METOCLOPRAMIDE HCL 5 MG/ML IJ SOLN
10.0000 mg | Freq: Once | INTRAMUSCULAR | Status: AC
  Administered 2017-07-22: 10 mg via INTRAVENOUS
  Filled 2017-07-22: qty 2

## 2017-07-22 MED ORDER — LOPERAMIDE HCL 2 MG PO CAPS: 2.0000 mg | ORAL_CAPSULE | Freq: Four times a day (QID) | ORAL | 0 refills | Status: DC | PRN

## 2017-07-22 MED ORDER — ONDANSETRON HCL 4 MG/2ML IJ SOLN
4.0000 mg | Freq: Once | INTRAMUSCULAR | Status: AC
Start: 2017-07-22 — End: 2017-07-22
  Administered 2017-07-22: 4 mg via INTRAVENOUS
  Filled 2017-07-22: qty 2

## 2017-07-22 MED ORDER — LOPERAMIDE HCL 2 MG PO CAPS
4.0000 mg | ORAL_CAPSULE | Freq: Once | ORAL | Status: DC
  Filled 2017-07-22: qty 2

## 2017-07-22 MED ORDER — ONDANSETRON 4 MG PO TBDP: 4.0000 mg | ORAL_TABLET | Freq: Three times a day (TID) | ORAL | 0 refills | Status: DC | PRN

## 2017-07-22 NOTE — ED Provider Notes (Signed)
[facility]-EMERGENCY DEPT Provider Note   CSN: [id number] Arrival date & time: 07/22/17  0508     History   Chief Complaint Chief Complaint  Patient presents with  . Nausea  . Emesis    HPI SHAWNIE NICOLE is a 26 y.o. female.  HPI  26 y.o. female, presents to the Emergency Department today due to N/V/D this AM. Denies sick contacts. No abdominal pain. No CP/SOB. No dysuria. No vaginal bleeding/discharge. Denies recent travel. Unsure if she ate any expired foods the night before. Pt without pain. No fevers. No meds PTA. No other symptoms noted.   Past Medical History:  Diagnosis Date  . Acid reflux   . Anemia   . Anxiety   . Bipolar 1 disorder (HCC)   . HSV infection     There are no active problems to display for this patient.   Past Surgical History:  Procedure Laterality Date  . TYMPANOSTOMY TUBE PLACEMENT    . WISDOM TOOTH EXTRACTION      OB History    Gravida Para Term Preterm AB Living   0 0 0 0 0 0   SAB TAB Ectopic Multiple Live Births   0 0 0 0         Home Medications    Prior to Admission medications   Medication Sig Start Date End Date Taking? Authorizing Provider  Carboxymethylcellul-Glycerin (OPTIVE) 0.5-0.9 % SOLN Place 1-2 drops into both eyes daily as needed (For dry eyes.).    [provider]  Dexlansoprazole 30 MG capsule Take 30 mg by mouth daily.    [provider]  fluconazole (DIFLUCAN) 150 MG tablet Take 1 tablet (150 mg total) by mouth daily. Patient not taking: Reported on 06/13/2015 03/18/15   Marny Lowenstein, PA-C  Hydrocodone-Acetaminophen (LORTAB PO) Take 1 tablet by mouth once.    [provider]  hyoscyamine (LEVSIN SL) 0.125 MG SL tablet Place 0.125 mg under the tongue every 4 (four) hours as needed for cramping.  03/30/15   [provider]  ibuprofen (ADVIL,MOTRIN) 600 MG tablet Take 1 tablet (600 mg total) by mouth every 6 (six) hours as needed. 06/13/15   Joycie Peek, PA-C    levonorgestrel-ethinyl estradiol (LUTERA) 0.1-20 MG-MCG tablet Take 1 tablet by mouth daily.    [provider]  loratadine (CLARITIN) 10 MG tablet Take 10 mg by mouth daily as needed for allergies.    [provider]  ondansetron (ZOFRAN-ODT) 4 MG disintegrating tablet Take 4 mg by mouth every 8 (eight) hours as needed for nausea or vomiting.  06/09/15   [provider]  OVER THE COUNTER MEDICATION Take 1 tablet by mouth daily. Qilib for Hair Regrowth + Revitalizing    [provider]  valACYclovir (VALTREX) 500 MG tablet Take 500 mg by mouth daily.    [provider]  Vitamin D, Ergocalciferol, (DRISDOL) 50000 UNITS CAPS capsule Take 50,000 Units by mouth every 7 (seven) days.    [provider]    Family History Family History  Problem Relation Age of Onset  . Diabetes Mother   . Hypertension Father     Social History Social History  Substance Use Topics  . Smoking status: Never Smoker  . Smokeless tobacco: Never Used  . Alcohol use No     Allergies   Patient has no known allergies.   Review of Systems Review of Systems ROS reviewed and all are negative for acute change except as noted in the HPI.  Physical Exam Updated Vital Signs BP 125/63   Pulse (!) 47   Temp 97.8 F (36.6 C) (Oral)   Resp (!) 21   Ht 5\' 5"  (1.651 m)   Wt 56.7 kg (125 lb)   SpO2 97%   BMI 20.80 kg/m   Physical Exam  Constitutional: She is oriented to person, place, and time. Vital signs are normal. She appears well-developed and well-nourished.  HENT:  Head: Normocephalic and atraumatic.  Right Ear: Hearing normal.  Left Ear: Hearing normal.  Eyes: Pupils are equal, round, and reactive to light. Conjunctivae and EOM are normal.  Neck: Normal range of motion. Neck supple.  Cardiovascular: Normal rate, regular rhythm, normal heart sounds and intact distal pulses.   Pulmonary/Chest: Effort normal and breath sounds normal.  Abdominal:  There is no tenderness. There is no rigidity, no rebound, no guarding, no CVA tenderness, no tenderness at McBurney's point and negative Murphy's sign.  Neurological: She is alert and oriented to person, place, and time.  Skin: Skin is warm and dry.  Psychiatric: She has a normal mood and affect. Her speech is normal and behavior is normal. Thought content normal.  Nursing note and vitals reviewed.  ED Treatments / Results  Labs (all labs ordered are listed, but only abnormal results are displayed) Labs Reviewed  CBC WITH DIFFERENTIAL/PLATELET - Abnormal; Notable for the following:       Result Value   WBC 15.9 (*)    Neutro Abs 11.6 (*)    All other components within normal limits  COMPREHENSIVE METABOLIC PANEL - Abnormal; Notable for the following:    Potassium 3.2 (*)    CO2 18 (*)    Glucose, Bld 148 (*)    All other components within normal limits  LIPASE, BLOOD  I-STAT BETA HCG BLOOD, ED ([facility], [facility], [facility] ONLY)    EKG  EKG Interpretation None       Radiology No results found.  Procedures Procedures (including critical care time)  Medications Ordered in ED Medications  sodium chloride 0.9 % bolus 1,000 mL (1,000 mLs Intravenous New Bag/Given 07/22/17 0522)  loperamide (IMODIUM) capsule 4 mg (not administered)  ondansetron (ZOFRAN) injection 4 mg (4 mg Intravenous Given 07/22/17 0521)  metoCLOPramide (REGLAN) injection 10 mg (10 mg Intravenous Given 07/22/17 0557)     Initial Impression / Assessment and Plan / ED Course  I have reviewed the triage vital signs and the nursing notes.  Pertinent labs & imaging results that were available during my care of the patient were reviewed by me and considered in my medical decision making (see chart for details).  Final Clinical Impressions(s) / ED Diagnoses  {I have reviewed and evaluated the relevant laboratory values.   {I have reviewed the relevant previous healthcare records.  {I obtained HPI from historian.   ED  Course:  Assessment: Patient is a 26 y.o. female presents to the Emergency Department today due to N/V/D this AM. Denies sick contacts. No abdominal pain. No CP/SOB. No dysuria. No vaginal bleeding/discharge. Denies recent travel. Unsure if she ate any expired foods the night before. Pt without pain. No fevers. On exam, nontoxic, nonseptic appearing, in no apparent distress. Patient's pain and other symptoms adequately managed in emergency department.  Fluid bolus given.  Labs and vitals reviewed.  Patient does not meet the SIRS or Sepsis criteria.  On repeat exam patient does not have a surgical abdomen and there are no peritoneal signs.  No indication of appendicitis, bowel obstruction, bowel perforation,  cholecystitis, diverticulitis, PID or ectopic pregnancy. Likely viral gastroenteritis. Patient discharged home with symptomatic treatment and given strict instructions for follow-up with their primary care physician.  I have also discussed reasons to return immediately to the ER.  Patient expresses understanding and agrees with plan.  Disposition/Plan:  DC Home Additional Verbal discharge instructions given and discussed with patient.  Pt Instructed to f/u with PCP in the next week for evaluation and treatment of symptoms. Return precautions given Pt acknowledges and agrees with plan  Supervising Physician Rancour, Jeannett Senior, MD  Final diagnoses:  Viral gastroenteritis    New Prescriptions New Prescriptions   No medications on file     Audry Pili, Cordelia Poche 07/22/17 1610    Glynn Octave, MD 07/22/17 [phone]

## 2017-07-22 NOTE — ED Notes (Signed)
Ginger ale given

## 2017-07-22 NOTE — ED Provider Notes (Signed)
  Physical Exam  BP (!) 110/58   Pulse 78   Temp 98.6 F (37 C) (Oral)   Resp 17   Ht 5\' 5"  (1.651 m)   Wt 56.7 kg (125 lb)   SpO2 100%   BMI 20.80 kg/m   Physical Exam  Constitutional: She appears well-developed and well-nourished. No distress.  HENT:  Head: Normocephalic and atraumatic.  Eyes: Conjunctivae and EOM are normal. No scleral icterus.  Neck: Normal range of motion.  Pulmonary/Chest: Effort normal. No respiratory distress.  Neurological: She is alert.  Skin: No rash noted. She is not diaphoretic.  Psychiatric: She has a normal mood and affect.  Nursing note and vitals reviewed.   ED Course  Procedures  MDM Care handed off from previous provider, Audry Piliyler Mohr PA-C. Briefly, patient presents with nausea, vomiting, diarrhea beginning this morning. She denies any abdominal pain. Patient given Reglan, Zofran 2. Care was signed out pending by mouth challenge with potassium pill and Imodium.  1047: Patient refuses by mouth medications. Patient given ginger ale for by mouth challenge.  1055: Patient states she was able to tolerate ginger ale. She states that she feels better and feels ready to go home. Patient provided with discharge paperwork and strict return precautions given.     Dietrich PatesKhatri, Aleysia Oltmann, PA-C 07/22/17 1055    Alvira MondaySchlossman, Erin, MD 07/23/17 1414

## 2017-07-22 NOTE — Discharge Instructions (Signed)
Please read and follow all provided instructions.  Your diagnoses today include:  1. Viral gastroenteritis    Tests performed today include: Blood counts and electrolytes Blood tests to check liver and kidney function Blood tests to check pancreas function Urine test to look for infection and pregnancy (in women) Vital signs. See below for your results today.   Medications prescribed:   Take any prescribed medications only as directed.  Home care instructions:  Follow any educational materials contained in this packet.  Follow-up instructions: Please follow-up with your primary care provider in the next 2 days for further evaluation of your symptoms.    Return instructions:  SEEK IMMEDIATE MEDICAL ATTENTION IF: The pain does not go away or becomes severe  A temperature above 101F develops  Repeated vomiting occurs (multiple episodes)  The pain becomes localized to portions of the abdomen. The right side could possibly be appendicitis. In an adult, the left lower portion of the abdomen could be colitis or diverticulitis.  Blood is being passed in stools or vomit (bright red or black tarry stools)  You develop chest pain, difficulty breathing, dizziness or fainting, or become confused, poorly responsive, or inconsolable (young children) If you have any other emergent concerns regarding your health  Additional Information: Abdominal (belly) pain can be caused by many things. Your caregiver performed an examination and possibly ordered blood/urine tests and imaging (CT scan, x-rays, ultrasound). Many cases can be observed and treated at home after initial evaluation in the emergency department. Even though you are being discharged home, abdominal pain can be unpredictable. Therefore, you need a repeated exam if your pain does not resolve, returns, or worsens. Most patients with abdominal pain don't have to be admitted to the hospital or have surgery, but serious problems like  appendicitis and gallbladder attacks can start out as nonspecific pain. Many abdominal conditions cannot be diagnosed in one visit, so follow-up evaluations are very important.  Your vital signs today were: BP 125/63    Pulse (!) 47    Temp 97.8 F (36.6 C) (Oral)    Resp (!) 21    Ht 5\' 5"  (1.651 m)    Wt 56.7 kg (125 lb)    SpO2 97%    BMI 20.80 kg/m  If your blood pressure (bp) was elevated above 135/85 this visit, please have this repeated by your doctor within one month. --------------

## 2017-07-22 NOTE — ED Notes (Signed)
Pt awoke suddenly with N/V/D. Denies previous illness herself, or to anyone else in the home. Denies travel or new restaurants. Denies bloody emesis or stool.

## 2017-07-22 NOTE — ED Notes (Signed)
Patient still refusing PO meds and wants to leave. Patient given ginger ale to see if able to tolerate. Emesis x3 since 7am, about 100 mls per emesis.

## 2017-07-26 DIAGNOSIS — R1110 Vomiting, unspecified: Secondary | ICD-10-CM | POA: Diagnosis not present

## 2017-07-26 DIAGNOSIS — R110 Nausea: Secondary | ICD-10-CM | POA: Diagnosis not present

## 2017-07-30 DIAGNOSIS — F39 Unspecified mood [affective] disorder: Secondary | ICD-10-CM | POA: Diagnosis not present

## 2017-07-30 DIAGNOSIS — F419 Anxiety disorder, unspecified: Secondary | ICD-10-CM | POA: Diagnosis not present

## 2017-07-30 DIAGNOSIS — F901 Attention-deficit hyperactivity disorder, predominantly hyperactive type: Secondary | ICD-10-CM | POA: Diagnosis not present

## 2017-08-28 DIAGNOSIS — F39 Unspecified mood [affective] disorder: Secondary | ICD-10-CM | POA: Diagnosis not present

## 2017-10-02 DIAGNOSIS — R102 Pelvic and perineal pain: Secondary | ICD-10-CM | POA: Diagnosis not present

## 2017-10-02 DIAGNOSIS — N930 Postcoital and contact bleeding: Secondary | ICD-10-CM | POA: Diagnosis not present

## 2017-10-02 DIAGNOSIS — N83209 Unspecified ovarian cyst, unspecified side: Secondary | ICD-10-CM | POA: Diagnosis not present

## 2017-10-02 DIAGNOSIS — R109 Unspecified abdominal pain: Secondary | ICD-10-CM | POA: Diagnosis not present

## 2017-11-13 DIAGNOSIS — N390 Urinary tract infection, site not specified: Secondary | ICD-10-CM | POA: Diagnosis not present

## 2017-11-21 DIAGNOSIS — Z6820 Body mass index (BMI) 20.0-20.9, adult: Secondary | ICD-10-CM | POA: Diagnosis not present

## 2017-11-21 DIAGNOSIS — N760 Acute vaginitis: Secondary | ICD-10-CM | POA: Diagnosis not present

## 2017-11-21 DIAGNOSIS — N390 Urinary tract infection, site not specified: Secondary | ICD-10-CM | POA: Diagnosis not present

## 2017-11-21 DIAGNOSIS — Z578 Occupational exposure to other risk factors: Secondary | ICD-10-CM | POA: Diagnosis not present

## 2017-11-28 DIAGNOSIS — R51 Headache: Secondary | ICD-10-CM | POA: Diagnosis not present

## 2017-11-28 DIAGNOSIS — N390 Urinary tract infection, site not specified: Secondary | ICD-10-CM | POA: Diagnosis not present

## 2017-12-13 DIAGNOSIS — Z3046 Encounter for surveillance of implantable subdermal contraceptive: Secondary | ICD-10-CM | POA: Diagnosis not present

## 2017-12-18 DIAGNOSIS — R109 Unspecified abdominal pain: Secondary | ICD-10-CM | POA: Diagnosis not present

## 2017-12-18 DIAGNOSIS — N83209 Unspecified ovarian cyst, unspecified side: Secondary | ICD-10-CM | POA: Diagnosis not present

## 2017-12-18 DIAGNOSIS — N941 Unspecified dyspareunia: Secondary | ICD-10-CM | POA: Diagnosis not present

## 2018-01-07 DIAGNOSIS — R51 Headache: Secondary | ICD-10-CM | POA: Diagnosis not present

## 2018-02-03 DIAGNOSIS — N83202 Unspecified ovarian cyst, left side: Secondary | ICD-10-CM | POA: Diagnosis not present

## 2018-02-03 DIAGNOSIS — R87612 Low grade squamous intraepithelial lesion on cytologic smear of cervix (LGSIL): Secondary | ICD-10-CM | POA: Diagnosis not present

## 2018-02-03 DIAGNOSIS — R87610 Atypical squamous cells of undetermined significance on cytologic smear of cervix (ASC-US): Secondary | ICD-10-CM | POA: Diagnosis not present

## 2018-02-03 DIAGNOSIS — N926 Irregular menstruation, unspecified: Secondary | ICD-10-CM | POA: Diagnosis not present

## 2018-02-03 DIAGNOSIS — R102 Pelvic and perineal pain: Secondary | ICD-10-CM | POA: Diagnosis not present

## 2018-02-03 DIAGNOSIS — N83209 Unspecified ovarian cyst, unspecified side: Secondary | ICD-10-CM | POA: Diagnosis not present

## 2018-02-03 DIAGNOSIS — Z01419 Encounter for gynecological examination (general) (routine) without abnormal findings: Secondary | ICD-10-CM | POA: Diagnosis not present

## 2018-02-10 DIAGNOSIS — Z13220 Encounter for screening for lipoid disorders: Secondary | ICD-10-CM | POA: Diagnosis not present

## 2018-02-10 DIAGNOSIS — Z1329 Encounter for screening for other suspected endocrine disorder: Secondary | ICD-10-CM | POA: Diagnosis not present

## 2018-02-10 DIAGNOSIS — Z114 Encounter for screening for human immunodeficiency virus [HIV]: Secondary | ICD-10-CM | POA: Diagnosis not present

## 2018-02-10 DIAGNOSIS — Z0000 Encounter for general adult medical examination without abnormal findings: Secondary | ICD-10-CM | POA: Diagnosis not present

## 2018-02-12 DIAGNOSIS — Z23 Encounter for immunization: Secondary | ICD-10-CM | POA: Diagnosis not present

## 2018-02-12 DIAGNOSIS — Z6820 Body mass index (BMI) 20.0-20.9, adult: Secondary | ICD-10-CM | POA: Diagnosis not present

## 2018-02-12 DIAGNOSIS — Z0000 Encounter for general adult medical examination without abnormal findings: Secondary | ICD-10-CM | POA: Diagnosis not present

## 2018-02-20 DIAGNOSIS — Z3202 Encounter for pregnancy test, result negative: Secondary | ICD-10-CM | POA: Diagnosis not present

## 2018-02-20 DIAGNOSIS — Z3046 Encounter for surveillance of implantable subdermal contraceptive: Secondary | ICD-10-CM | POA: Diagnosis not present

## 2018-02-20 DIAGNOSIS — Z30015 Encounter for initial prescription of vaginal ring hormonal contraceptive: Secondary | ICD-10-CM | POA: Diagnosis not present

## 2018-02-26 DIAGNOSIS — F3181 Bipolar II disorder: Secondary | ICD-10-CM | POA: Diagnosis not present

## 2018-03-05 DIAGNOSIS — F3181 Bipolar II disorder: Secondary | ICD-10-CM | POA: Diagnosis not present

## 2018-03-12 DIAGNOSIS — F3181 Bipolar II disorder: Secondary | ICD-10-CM | POA: Diagnosis not present

## 2018-03-19 DIAGNOSIS — F3181 Bipolar II disorder: Secondary | ICD-10-CM | POA: Diagnosis not present

## 2018-03-26 DIAGNOSIS — F3181 Bipolar II disorder: Secondary | ICD-10-CM | POA: Diagnosis not present

## 2018-04-02 DIAGNOSIS — F3181 Bipolar II disorder: Secondary | ICD-10-CM | POA: Diagnosis not present

## 2018-04-16 DIAGNOSIS — F3181 Bipolar II disorder: Secondary | ICD-10-CM | POA: Diagnosis not present

## 2018-04-17 DIAGNOSIS — A609 Anogenital herpesviral infection, unspecified: Secondary | ICD-10-CM | POA: Diagnosis not present

## 2018-04-24 DIAGNOSIS — F3181 Bipolar II disorder: Secondary | ICD-10-CM | POA: Diagnosis not present

## 2018-04-30 DIAGNOSIS — F3181 Bipolar II disorder: Secondary | ICD-10-CM | POA: Diagnosis not present

## 2018-05-07 DIAGNOSIS — F3181 Bipolar II disorder: Secondary | ICD-10-CM | POA: Diagnosis not present

## 2018-05-14 DIAGNOSIS — F3181 Bipolar II disorder: Secondary | ICD-10-CM | POA: Diagnosis not present

## 2018-05-21 DIAGNOSIS — F3181 Bipolar II disorder: Secondary | ICD-10-CM | POA: Diagnosis not present

## 2018-05-28 DIAGNOSIS — F3181 Bipolar II disorder: Secondary | ICD-10-CM | POA: Diagnosis not present

## 2018-06-04 DIAGNOSIS — F3181 Bipolar II disorder: Secondary | ICD-10-CM | POA: Diagnosis not present

## 2018-06-11 DIAGNOSIS — F3181 Bipolar II disorder: Secondary | ICD-10-CM | POA: Diagnosis not present

## 2018-06-25 DIAGNOSIS — F3181 Bipolar II disorder: Secondary | ICD-10-CM | POA: Diagnosis not present

## 2018-07-09 DIAGNOSIS — F3181 Bipolar II disorder: Secondary | ICD-10-CM | POA: Diagnosis not present

## 2018-07-16 DIAGNOSIS — F3181 Bipolar II disorder: Secondary | ICD-10-CM | POA: Diagnosis not present

## 2018-07-23 DIAGNOSIS — F3181 Bipolar II disorder: Secondary | ICD-10-CM | POA: Diagnosis not present

## 2018-07-30 DIAGNOSIS — F3181 Bipolar II disorder: Secondary | ICD-10-CM | POA: Diagnosis not present

## 2018-08-08 DIAGNOSIS — F3181 Bipolar II disorder: Secondary | ICD-10-CM | POA: Diagnosis not present

## 2018-08-12 DIAGNOSIS — Z23 Encounter for immunization: Secondary | ICD-10-CM | POA: Diagnosis not present

## 2018-08-15 DIAGNOSIS — F3181 Bipolar II disorder: Secondary | ICD-10-CM | POA: Diagnosis not present

## 2018-08-20 DIAGNOSIS — F3181 Bipolar II disorder: Secondary | ICD-10-CM | POA: Diagnosis not present

## 2018-08-26 DIAGNOSIS — H04123 Dry eye syndrome of bilateral lacrimal glands: Secondary | ICD-10-CM | POA: Diagnosis not present

## 2018-08-26 DIAGNOSIS — H40033 Anatomical narrow angle, bilateral: Secondary | ICD-10-CM | POA: Diagnosis not present

## 2018-08-27 DIAGNOSIS — F3181 Bipolar II disorder: Secondary | ICD-10-CM | POA: Diagnosis not present

## 2018-09-02 DIAGNOSIS — F3181 Bipolar II disorder: Secondary | ICD-10-CM | POA: Diagnosis not present

## 2018-09-05 DIAGNOSIS — N760 Acute vaginitis: Secondary | ICD-10-CM | POA: Diagnosis not present

## 2018-09-08 DIAGNOSIS — F3181 Bipolar II disorder: Secondary | ICD-10-CM | POA: Diagnosis not present

## 2018-09-16 DIAGNOSIS — K080 Exfoliation of teeth due to systemic causes: Secondary | ICD-10-CM | POA: Diagnosis not present

## 2018-09-17 DIAGNOSIS — F3181 Bipolar II disorder: Secondary | ICD-10-CM | POA: Diagnosis not present

## 2018-09-19 DIAGNOSIS — K080 Exfoliation of teeth due to systemic causes: Secondary | ICD-10-CM | POA: Diagnosis not present

## 2018-09-25 DIAGNOSIS — F3181 Bipolar II disorder: Secondary | ICD-10-CM | POA: Diagnosis not present

## 2018-10-01 DIAGNOSIS — F3181 Bipolar II disorder: Secondary | ICD-10-CM | POA: Diagnosis not present

## 2018-10-09 DIAGNOSIS — F3181 Bipolar II disorder: Secondary | ICD-10-CM | POA: Diagnosis not present

## 2018-10-16 DIAGNOSIS — F3181 Bipolar II disorder: Secondary | ICD-10-CM | POA: Diagnosis not present

## 2018-10-22 DIAGNOSIS — B009 Herpesviral infection, unspecified: Secondary | ICD-10-CM | POA: Diagnosis not present

## 2018-10-22 DIAGNOSIS — K219 Gastro-esophageal reflux disease without esophagitis: Secondary | ICD-10-CM | POA: Diagnosis not present

## 2018-10-22 DIAGNOSIS — Z6820 Body mass index (BMI) 20.0-20.9, adult: Secondary | ICD-10-CM | POA: Diagnosis not present

## 2018-10-22 DIAGNOSIS — N946 Dysmenorrhea, unspecified: Secondary | ICD-10-CM | POA: Diagnosis not present

## 2018-10-30 DIAGNOSIS — F3181 Bipolar II disorder: Secondary | ICD-10-CM | POA: Diagnosis not present

## 2018-11-03 DIAGNOSIS — F3181 Bipolar II disorder: Secondary | ICD-10-CM | POA: Diagnosis not present

## 2018-11-13 DIAGNOSIS — F3181 Bipolar II disorder: Secondary | ICD-10-CM | POA: Diagnosis not present

## 2018-11-18 DIAGNOSIS — F3181 Bipolar II disorder: Secondary | ICD-10-CM | POA: Diagnosis not present

## 2018-12-02 DIAGNOSIS — F3181 Bipolar II disorder: Secondary | ICD-10-CM | POA: Diagnosis not present

## 2018-12-08 DIAGNOSIS — H04123 Dry eye syndrome of bilateral lacrimal glands: Secondary | ICD-10-CM | POA: Diagnosis not present

## 2018-12-10 NOTE — L&D Delivery Note (Addendum)
Delivery Note At 12:33 AM a viable female was delivered via Vaginal, Spontaneous, LOA  APGAR: , ; weight pending skin-to-skin (infant at [facility])  .   Placenta status: delivred intact at [facility] , .  Cord: 3VC  with the following complications: .NONE. Saved for patient collection; cooler brought from home  Anesthesia:  epidural Episiotomy: None Lacerations: 2nd degree;Periurethral;Sulcus Suture Repair: 3-0 vicryl on CT< 4-0 vicryl on SH Est. Blood Loss (mL):    Mom to postpartum.  Baby to Couplet care / Skin to Skin.  Melida Quitter Gerlean Cid 08/15/2019, 1:02 AM

## 2018-12-14 DIAGNOSIS — F3181 Bipolar II disorder: Secondary | ICD-10-CM | POA: Diagnosis not present

## 2018-12-15 DIAGNOSIS — F3181 Bipolar II disorder: Secondary | ICD-10-CM | POA: Diagnosis not present

## 2018-12-17 DIAGNOSIS — Z23 Encounter for immunization: Secondary | ICD-10-CM | POA: Diagnosis not present

## 2018-12-17 DIAGNOSIS — Z117 Encounter for testing for latent tuberculosis infection: Secondary | ICD-10-CM | POA: Diagnosis not present

## 2018-12-17 DIAGNOSIS — H04123 Dry eye syndrome of bilateral lacrimal glands: Secondary | ICD-10-CM | POA: Diagnosis not present

## 2018-12-17 DIAGNOSIS — Z6820 Body mass index (BMI) 20.0-20.9, adult: Secondary | ICD-10-CM | POA: Diagnosis not present

## 2018-12-17 DIAGNOSIS — Z111 Encounter for screening for respiratory tuberculosis: Secondary | ICD-10-CM | POA: Diagnosis not present

## 2018-12-17 DIAGNOSIS — H3530 Unspecified macular degeneration: Secondary | ICD-10-CM | POA: Diagnosis not present

## 2018-12-19 DIAGNOSIS — Z6820 Body mass index (BMI) 20.0-20.9, adult: Secondary | ICD-10-CM | POA: Diagnosis not present

## 2018-12-19 DIAGNOSIS — Z23 Encounter for immunization: Secondary | ICD-10-CM | POA: Diagnosis not present

## 2018-12-22 DIAGNOSIS — F3181 Bipolar II disorder: Secondary | ICD-10-CM | POA: Diagnosis not present

## 2018-12-30 DIAGNOSIS — F3181 Bipolar II disorder: Secondary | ICD-10-CM | POA: Diagnosis not present

## 2019-01-06 DIAGNOSIS — F3181 Bipolar II disorder: Secondary | ICD-10-CM | POA: Diagnosis not present

## 2019-01-14 DIAGNOSIS — F3181 Bipolar II disorder: Secondary | ICD-10-CM | POA: Diagnosis not present

## 2019-01-20 DIAGNOSIS — F3181 Bipolar II disorder: Secondary | ICD-10-CM | POA: Diagnosis not present

## 2019-01-31 DIAGNOSIS — F3181 Bipolar II disorder: Secondary | ICD-10-CM | POA: Diagnosis not present

## 2019-02-03 DIAGNOSIS — F3181 Bipolar II disorder: Secondary | ICD-10-CM | POA: Diagnosis not present

## 2019-02-04 ENCOUNTER — Other Ambulatory Visit: Payer: Self-pay

## 2019-02-04 ENCOUNTER — Inpatient Hospital Stay (HOSPITAL_BASED_OUTPATIENT_CLINIC_OR_DEPARTMENT_OTHER): Payer: Self-pay

## 2019-02-04 ENCOUNTER — Inpatient Hospital Stay (HOSPITAL_COMMUNITY)
Admission: AD | Admit: 2019-02-04 | Discharge: 2019-02-04 | Disposition: A | Discharge status: Home / Self Care | Payer: Self-pay | Attending: Obstetrics & Gynecology | Admitting: Obstetrics & Gynecology

## 2019-02-04 ENCOUNTER — Encounter (HOSPITAL_COMMUNITY): Payer: Self-pay | Admitting: *Deleted

## 2019-02-04 DIAGNOSIS — O26891 Other specified pregnancy related conditions, first trimester: Secondary | ICD-10-CM

## 2019-02-04 DIAGNOSIS — Z3A11 11 weeks gestation of pregnancy: Secondary | ICD-10-CM

## 2019-02-04 DIAGNOSIS — R51 Headache: Secondary | ICD-10-CM

## 2019-02-04 DIAGNOSIS — M545 Low back pain, unspecified: Secondary | ICD-10-CM

## 2019-02-04 DIAGNOSIS — R519 Headache, unspecified: Secondary | ICD-10-CM

## 2019-02-04 DIAGNOSIS — R102 Pelvic and perineal pain unspecified side: Secondary | ICD-10-CM

## 2019-02-04 DIAGNOSIS — Z3A14 14 weeks gestation of pregnancy: Secondary | ICD-10-CM | POA: Insufficient documentation

## 2019-02-04 DIAGNOSIS — O26892 Other specified pregnancy related conditions, second trimester: Secondary | ICD-10-CM

## 2019-02-04 DIAGNOSIS — O26899 Other specified pregnancy related conditions, unspecified trimester: Secondary | ICD-10-CM

## 2019-02-04 HISTORY — DX: Bipolar disorder, unspecified: F31.9

## 2019-02-04 LAB — WET PREP, GENITAL
Clue Cells Wet Prep HPF POC: NONE SEEN
Sperm: NONE SEEN
Trich, Wet Prep: NONE SEEN
YEAST WET PREP: NONE SEEN

## 2019-02-04 LAB — URINALYSIS, ROUTINE W REFLEX MICROSCOPIC
Bilirubin Urine: NEGATIVE
GLUCOSE, UA: NEGATIVE mg/dL
HGB URINE DIPSTICK: NEGATIVE
KETONES UR: NEGATIVE mg/dL
Leukocytes,Ua: NEGATIVE
NITRITE: NEGATIVE
PROTEIN: NEGATIVE mg/dL
Specific Gravity, Urine: 1.008 (ref 1.005–1.030)
pH: 8 (ref 5.0–8.0)

## 2019-02-04 LAB — CBC
HEMATOCRIT: 37.9 % (ref 36.0–46.0)
Hemoglobin: 13.3 g/dL (ref 12.0–15.0)
MCH: 32 pg (ref 26.0–34.0)
MCHC: 35.1 g/dL (ref 30.0–36.0)
MCV: 91.3 fL (ref 80.0–100.0)
NRBC: 0 % (ref 0.0–0.2)
Platelets: 150 10*3/uL (ref 150–400)
RBC: 4.15 MIL/uL (ref 3.87–5.11)
RDW: 12.7 % (ref 11.5–15.5)
WBC: 10.1 10*3/uL (ref 4.0–10.5)

## 2019-02-04 MED ORDER — ACETAMINOPHEN 325 MG PO TABS
650.0000 mg | ORAL_TABLET | Freq: Four times a day (QID) | ORAL | Status: DC | PRN
  Administered 2019-02-04: 650 mg via ORAL
  Filled 2019-02-04: qty 2

## 2019-02-04 NOTE — MAU Provider Note (Signed)
History     CSN: [id number]  Arrival date and time: 02/04/19 1206    Chief Complaint  Patient presents with  . Back Pain  . Cramps   27yo F G1P0 here with c/o hot flashes, headache, nausea, low back pain and cramping since this morning. Pt is [redacted]w[redacted]d by known LMP 10/29/2018. Pt reports this morning while at work and standing she experienced hot flashes and nausea. Pt reports vomiting once per day since +UPT, but reports able to tolerate normal diet. Does not eat q2-3hrs, drinks 3 small bottles of water per day, stands greater than 4hrs at a time at work. Denies VB, LOF, vaginal discharge/itching/odor. Pt denies abdominal pain, constipation, diarrhea, dysuria, frequency. Pt has new OB appt scheduled with [facility] next week. Pt reports hx of HA attributed to stress, reports slight increase in HA since pregnancy. Denies vision changes. Pt reports back pain is dull, aching. NKDA, pt reports not taking any medications at home.   OB History    Gravida  1   Para  0   Term  0   Preterm  0   AB  0   Living  0     SAB  0   TAB  0   Ectopic  0   Multiple  0   Live Births              Past Medical History:  Diagnosis Date  . Acid reflux   . Anemia   . Anxiety   . Bipolar 1 disorder (HCC)   . Bipolar disorder (HCC)   . HSV infection     Past Surgical History:  Procedure Laterality Date  . TYMPANOSTOMY TUBE PLACEMENT    . WISDOM TOOTH EXTRACTION      Family History  Problem Relation Age of Onset  . Diabetes Mother   . Hypertension Father     Social History   Tobacco Use  . Smoking status: Never Smoker  . Smokeless tobacco: Never Used  Substance Use Topics  . Alcohol use: No  . Drug use: No    Allergies: No Known Allergies  Medications Prior to Admission  Medication Sig Dispense Refill Last Dose  . fluconazole (DIFLUCAN) 150 MG tablet Take 1 tablet (150 mg total) by mouth daily. (Patient not taking: Reported on 06/13/2015) 1 tablet 0 Completed  Course at Unknown time  . ibuprofen (ADVIL,MOTRIN) 600 MG tablet Take 1 tablet (600 mg total) by mouth every 6 (six) hours as needed. (Patient not taking: Reported on 07/22/2017) 30 tablet 0 Completed Course at Unknown time  . loperamide (IMODIUM) 2 MG capsule Take 1 capsule (2 mg total) by mouth 4 (four) times daily as needed for diarrhea or loose stools. 12 capsule 0   . omeprazole (PRILOSEC) 40 MG capsule Take 40 mg by mouth daily.  3 07/21/2017 at Unknown time  . ondansetron (ZOFRAN ODT) 4 MG disintegrating tablet Take 1 tablet (4 mg total) by mouth every 8 (eight) hours as needed for nausea or vomiting. 20 tablet 0     Review of Systems  Constitutional: Negative for appetite change, chills and fever.  Gastrointestinal: Positive for nausea. Negative for abdominal pain, constipation, diarrhea and vomiting.  Endocrine:       Hot flashes  Genitourinary: Positive for pelvic pain. Negative for dysuria, flank pain, frequency, vaginal bleeding, vaginal discharge and vaginal pain.  Neurological: Positive for headaches. Negative for dizziness.   Physical Exam   Blood pressure (!) 113/50, pulse 78,  temperature 98.4 F (36.9 C), temperature source Oral, resp. rate 20, height 5\' 5"  (1.651 m), weight 57.2 kg, last menstrual period 10/29/2018, SpO2 96 %.  Physical Exam  Constitutional: She is oriented to person, place, and time. She appears well-developed and well-nourished. No distress.  GI: Soft. She exhibits no distension and no mass. There is abdominal tenderness in the suprapubic area and left lower quadrant. There is no rebound, no guarding and no CVA tenderness.  Genitourinary:    Vagina normal.     Genitourinary Comments: Cervix long and closed, normal scant discharge present, no evidence of bleeding, no adnexal or uterine tenderness, uterus retroverted, size c/w 10wk gravid uterus.   Neurological: She is alert and oriented to person, place, and time.  Skin: Skin is warm and dry.   Psychiatric: She has a normal mood and affect. Her behavior is normal.   Results for orders placed or performed during the hospital encounter of 02/04/19 (from the past 24 hour(s))  Urinalysis, Routine w reflex microscopic     Status: None   Collection Time: 02/04/19 12:46 PM  Result Value Ref Range   Color, Urine YELLOW YELLOW   APPearance CLEAR CLEAR   Specific Gravity, Urine 1.008 1.005 - 1.030   pH 8.0 5.0 - 8.0   Glucose, UA NEGATIVE NEGATIVE mg/dL   Hgb urine dipstick NEGATIVE NEGATIVE   Bilirubin Urine NEGATIVE NEGATIVE   Ketones, ur NEGATIVE NEGATIVE mg/dL   Protein, ur NEGATIVE NEGATIVE mg/dL   Nitrite NEGATIVE NEGATIVE   Leukocytes,Ua NEGATIVE NEGATIVE  Wet prep, genital     Status: Abnormal   Collection Time: 02/04/19  1:29 PM  Result Value Ref Range   Yeast Wet Prep HPF POC NONE SEEN NONE SEEN   Trich, Wet Prep NONE SEEN NONE SEEN   Clue Cells Wet Prep HPF POC NONE SEEN NONE SEEN   WBC, Wet Prep HPF POC MODERATE (A) NONE SEEN   Sperm NONE SEEN   CBC     Status: None   Collection Time: 02/04/19  1:40 PM  Result Value Ref Range   WBC 10.1 4.0 - 10.5 K/uL   RBC 4.15 3.87 - 5.11 MIL/uL   Hemoglobin 13.3 12.0 - 15.0 g/dL   HCT 95.6 21.3 - 08.6 %   MCV 91.3 80.0 - 100.0 fL   MCH 32.0 26.0 - 34.0 pg   MCHC 35.1 30.0 - 36.0 g/dL   RDW 57.8 46.9 - 62.9 %   Platelets 150 150 - 400 K/uL   nRBC 0.0 0.0 - 0.2 %   [place] Mfm Ob Limited  Result Date: 02/04/2019 ----------------------------------------------------------------------  OBSTETRICS REPORT                       (Signed Final 02/04/2019 03:05 pm) ---------------------------------------------------------------------- Patient Info  ID #:       [id number]                          D.O.B.:  08-Feb-1991 (27 yrs)  Name:       Megan Owens               Visit Date: 02/04/2019 02:04 pm ---------------------------------------------------------------------- Performed By  Performed By:     Benjamine Mola Vics             Referred By:       Marylen Ponto  RDMS,RVT  Attending:        Noralee Space MD        [facility] ---------------------------------------------------------------------- Orders   #  Description                          Code         Ordered By   1  [place] MFM OB LIMITED                    [phone]     Mane Consolo  ----------------------------------------------------------------------   #  Order #                    Accession #                 Episode #   1  [id number]                  [id number]                  [id number]  ---------------------------------------------------------------------- Indications   Pelvic pain affecting pregnancy in second      O26.892   trimester (Back pain worse than normal   today)   [redacted] weeks gestation of pregnancy                Z3A.14  ---------------------------------------------------------------------- Vital Signs  Weight (lb): 126                               Height:        5'5"  BMI:         20.97 ---------------------------------------------------------------------- Fetal Evaluation  Num Of Fetuses:         1  Fetal Heart Rate(bpm):  164  Cardiac Activity:       Observed  Presentation:           Variable  Placenta:               Posterior  Amniotic Fluid  AFI FV:      Within normal limits ---------------------------------------------------------------------- Biometry  CRL:      44.9  mm     G. Age:  11w 1d                  EDD:   08/25/19 ---------------------------------------------------------------------- OB History  Gravidity:    1 ---------------------------------------------------------------------- Gestational Age  LMP:           14w 0d        Date:  10/29/18                 EDD:   08/05/19  Best:          14w 0d     Det. By:  LMP  (10/29/18)          EDD:   08/05/19 ---------------------------------------------------------------------- Cervix Uterus Adnexa  Cervix  Length:             3.8  cm.  Normal appearance by transabdominal scan.  Uterus  No abnormality visualized.  Left Ovary  Within normal limits.  Right Ovary  Within normal limits. ---------------------------------------------------------------------- Impression  Patient was evaluated for abdominal pain.  A limited ultrasound study was performed. Amniotic fluid is  normal. No subchorionic hemorrhage is seen. On  transabdominal scan, the cervix looks long and closed.  The CRL measurement roughly corresponds to 11 weeks. ---------------------------------------------------------------------- Recommendations  -I recommend a pregnancy dating scan in 1 to 2 weeks that  also affords an opportunity to performe first-trimester  screening (nuchal translucency) if the patient desires. ----------------------------------------------------------------------                  Noralee Space, MD Electronically Signed Final Report   02/04/2019 03:05 pm ----------------------------------------------------------------------   MAU Course  Procedures  MDM  Orders Placed This Encounter  Procedures  . Wet prep, genital    Standing Status:   Standing    Number of Occurrences:   1  . [place] MFM OB LIMITED    Sure LMP 10/29/2018, today [redacted]w[redacted]d by LMP    Standing Status:   Standing    Number of Occurrences:   1    Order Specific Question:   Symptom/Reason for Exam    Answer:   Pelvic pain [409811]  . Urinalysis, Routine w reflex microscopic    Standing Status:   Standing    Number of Occurrences:   1  . CBC    Standing Status:   Standing    Number of Occurrences:   1  . Discharge patient    Order Specific Question:   Discharge disposition    Answer:   01-Home or Self Care [1]    Order Specific Question:   Discharge patient date    Answer:   02/04/2019   Meds ordered this encounter  Medications  . acetaminophen (TYLENOL) tablet 650 mg   No evidence of impending SAB, pelvic pain is likely musculoskeletal. HA responded well to Tylenol. No  s/sx of UTI or pelvic infection. Stable for discharge.  Assessment and Plan   1. Pregnancy headache, antepartum   2. Pelvic pain   3. Pelvic pain in pregnancy   4. Low back pain during pregnancy, antepartum   5. [redacted] weeks gestation of pregnancy    Allergies as of 02/04/2019   No Known Allergies     Medication List    STOP taking these medications   fluconazole 150 MG tablet Commonly known as:  DIFLUCAN   ibuprofen 600 MG tablet Commonly known as:  ADVIL,MOTRIN   loperamide 2 MG capsule Commonly known as:  IMODIUM   omeprazole 40 MG capsule Commonly known as:  PRILOSEC   ondansetron 4 MG disintegrating tablet Commonly known as:  ZOFRAN ODT      -f/u with [facility] Department for your prenatal care as scheduled on March 2nd. -it is recommended that you have a f/u dating scan with nuchal translucency, if desired. -continue taking your PNV. -take Tylenol, PRN for HA - do not exceed amount listed on package instructions -return precautions discussed.  Odie Sera Lisset Ketchem 02/04/2019, 3:15 PM

## 2019-02-04 NOTE — Discharge Instructions (Signed)
Abdominal Pain During Pregnancy  Belly (abdominal) pain is common during pregnancy. There are many possible causes. Most of the time, it is not a serious problem. Other times, it can be a sign that something is wrong with the pregnancy. Always tell your doctor if you have belly pain. Follow these instructions at home:  Do not have sex or put anything in your vagina until your pain goes away completely.  Get plenty of rest until your pain gets better.  Drink enough fluid to keep your pee (urine) pale yellow.  Take over-the-counter and prescription medicines only as told by your doctor.  Keep all follow-up visits as told by your doctor. This is important. Contact a doctor if:  Your pain continues or gets worse after resting.  You have lower belly pain that: ? Comes and goes at regular times. ? Spreads to your back. ? Feels like menstrual cramps.  You have pain or burning when you pee (urinate). Get help right away if:  You have a fever or chills.  You have vaginal bleeding.  You are leaking fluid from your vagina.  You are passing tissue from your vagina.  You throw up (vomit) for more than 24 hours.  You have watery poop (diarrhea) for more than 24 hours.  Your baby is moving less than usual.  You feel very weak or faint.  You have shortness of breath.  You have very bad pain in your upper belly. Summary  Belly (abdominal) pain is common during pregnancy. There are many possible causes.  If you have belly pain during pregnancy, tell your doctor right away.  Keep all follow-up visits as told by your doctor. This is important. This information is not intended to replace advice given to you by your health care provider. Make sure you discuss any questions you have with your health care provider. Document Released: 11/14/2009 Document Revised: 02/28/2017 Document Reviewed: 02/28/2017 Elsevier Interactive Patient Education  2019 [employer].   Back Pain in  Pregnancy Back pain during pregnancy is common. Back pain may be caused by several factors that are related to changes during your pregnancy. Follow these instructions at home: Managing pain, stiffness, and swelling      If directed, for sudden (acute) back pain, put ice on the painful area. ? Put ice in a plastic bag. ? Place a towel between your skin and the bag. ? Leave the ice on for 20 minutes, 2-3 times per day.  If directed, apply heat to the affected area before you exercise. Use the heat source that your health care provider recommends, such as a moist heat pack or a heating pad. ? Place a towel between your skin and the heat source. ? Leave the heat on for 20-30 minutes. ? Remove the heat if your skin turns bright red. This is especially important if you are unable to feel pain, heat, or cold. You may have a greater risk of getting burned.  If directed, massage the affected area. Activity  Exercise as told by your health care provider. Gentle exercise is the best way to prevent or manage back pain.  Listen to your body when lifting. If lifting hurts, ask for help or bend your knees. This uses your leg muscles instead of your back muscles.  Squat down when picking up something from the floor. Do not bend over.  Only use bed rest for short periods as told by your health care provider. Bed rest should only be used for the most  severe episodes of back pain. Standing, sitting, and lying down  Do not stand in one place for long periods of time.  Use good posture when sitting. Make sure your head rests over your shoulders and is not hanging forward. Use a pillow on your lower back if necessary.  Try sleeping on your side, preferably the left side, with a pregnancy support pillow or 1-2 regular pillows between your legs. ? If you have back pain after a night's rest, your bed may be too soft. ? A firm mattress may provide more support for your back during pregnancy. General  instructions  Do not wear high heels.  Eat a healthy diet. Try to gain weight within your health care provider's recommendations.  Use a maternity girdle, elastic sling, or back brace as told by your health care provider.  Take over-the-counter and prescription medicines only as told by your health care provider.  Work with a physical therapist or massage therapist to find ways to manage back pain. Acupuncture or massage therapy may be helpful.  Keep all follow-up visits as told by your health care provider. This is important. Contact a health care provider if:  Your back pain interferes with your daily activities.  You have increasing pain in other parts of your body. Get help right away if:  You develop numbness, tingling, weakness, or problems with the use of your arms or legs.  You develop severe back pain that is not controlled with medicine.  You have a change in bowel or bladder control.  You develop shortness of breath, dizziness, or you faint.  You develop nausea, vomiting, or sweating.  You have back pain that is a rhythmic, cramping pain similar to labor pains. Labor pain is usually 1-2 minutes apart, lasts for about 1 minute, and involves a bearing down feeling or pressure in your pelvis.  You have back pain and your water breaks or you have vaginal bleeding.  You have back pain or numbness that travels down your leg.  Your back pain developed after you fell.  You develop pain on one side of your back.  You see blood in your urine.  You develop skin blisters in the area of your back pain. Summary  Back pain may be caused by several factors that are related to changes during your pregnancy.  Follow instructions as told by your health care provider for managing pain, stiffness, and swelling.  Exercise as told by your health care provider. Gentle exercise is the best way to prevent or manage back pain.  Take over-the-counter and prescription medicines only  as told by your health care provider.  Keep all follow-up visits as told by your health care provider. This is important. This information is not intended to replace advice given to you by your health care provider. Make sure you discuss any questions you have with your health care provider. Document Released: 03/06/2006 Document Revised: 05/14/2018 Document Reviewed: 05/14/2018 Elsevier Interactive Patient Education  2019 [employer].                   Safe Medications in Pregnancy    Acne: Benzoyl Peroxide Salicylic Acid  Backache/Headache: Tylenol: 2 regular strength every 4 hours OR              2 Extra strength every 6 hours  Colds/Coughs/Allergies: Benadryl (alcohol free) 25 mg every 6 hours as needed Breath right strips Claritin Cepacol throat lozenges Chloraseptic throat spray Cold-Eeze- up to three times per day  Cough drops, alcohol free Flonase (by prescription only) Guaifenesin Mucinex Robitussin DM (plain only, alcohol free) Saline nasal spray/drops Sudafed (pseudoephedrine) & Actifed ** use only after [redacted] weeks gestation and if you do not have high blood pressure Tylenol Vicks Vaporub Zinc lozenges Zyrtec   Constipation: Colace Ducolax suppositories Fleet enema Glycerin suppositories Metamucil Milk of magnesia Miralax Senokot Smooth move tea  Diarrhea: Kaopectate Imodium A-D  *NO pepto Bismol  Hemorrhoids: Anusol Anusol HC Preparation H Tucks  Indigestion: Tums Maalox Mylanta Zantac  Pepcid  Insomnia: Benadryl (alcohol free)  every 6 hours as needed Tylenol PM Unisom, no Gelcaps  Leg Cramps: Tums MagGel  Nausea/Vomiting:  Bonine Dramamine Emetrol Ginger extract Sea bands Meclizine  Nausea medication to take during pregnancy:  Unisom (doxylamine succinate 25 mg tablets) Take one tablet daily at bedtime. If symptoms are not adequately controlled, the dose can be increased to a maximum recommended dose of two tablets  daily (1/2 tablet in the morning, 1/2 tablet mid-afternoon and one at bedtime). Vitamin B6  tablets. Take one tablet twice a day (up to 200 mg per day).  Skin Rashes: Aveeno products Benadryl cream or  every 6 hours as needed Calamine Lotion 1% cortisone cream  Yeast infection: Gyne-lotrimin 7 Monistat 7   **If taking multiple medications, please check labels to avoid duplicating the same active ingredients **take medication as directed on the label ** Do not exceed 4000 mg of tylenol in 24 hours **Do not take medications that contain aspirin or ibuprofen

## 2019-02-04 NOTE — MAU Note (Signed)
Presents with c/o lower back pain & and cramping.  Denies VB.  LMP 10/29/2018.  Reports [facility] @ [facility].

## 2019-02-05 LAB — GC/CHLAMYDIA PROBE AMP (~~LOC~~) NOT AT ARMC
CHLAMYDIA, DNA PROBE: NEGATIVE
Neisseria Gonorrhea: NEGATIVE

## 2019-02-09 ENCOUNTER — Other Ambulatory Visit (HOSPITAL_COMMUNITY): Payer: Self-pay | Admitting: Nurse Practitioner

## 2019-02-09 DIAGNOSIS — K219 Gastro-esophageal reflux disease without esophagitis: Secondary | ICD-10-CM | POA: Diagnosis not present

## 2019-02-09 DIAGNOSIS — A6000 Herpesviral infection of urogenital system, unspecified: Secondary | ICD-10-CM | POA: Diagnosis not present

## 2019-02-09 DIAGNOSIS — Z369 Encounter for antenatal screening, unspecified: Secondary | ICD-10-CM

## 2019-02-09 DIAGNOSIS — F909 Attention-deficit hyperactivity disorder, unspecified type: Secondary | ICD-10-CM | POA: Diagnosis not present

## 2019-02-09 DIAGNOSIS — Z3401 Encounter for supervision of normal first pregnancy, first trimester: Secondary | ICD-10-CM | POA: Diagnosis not present

## 2019-02-10 DIAGNOSIS — F3181 Bipolar II disorder: Secondary | ICD-10-CM | POA: Diagnosis not present

## 2019-02-17 DIAGNOSIS — F3181 Bipolar II disorder: Secondary | ICD-10-CM | POA: Diagnosis not present

## 2019-02-18 ENCOUNTER — Encounter (HOSPITAL_COMMUNITY): Payer: Self-pay

## 2019-02-19 ENCOUNTER — Emergency Department (HOSPITAL_COMMUNITY)
Admission: EM | Admit: 2019-02-19 | Discharge: 2019-02-20 | Disposition: A | Discharge status: Home / Self Care | Payer: BLUE CROSS/BLUE SHIELD | Attending: Emergency Medicine | Admitting: Emergency Medicine

## 2019-02-19 ENCOUNTER — Encounter (HOSPITAL_COMMUNITY): Payer: Self-pay

## 2019-02-19 DIAGNOSIS — Y939 Activity, unspecified: Secondary | ICD-10-CM | POA: Diagnosis not present

## 2019-02-19 DIAGNOSIS — Y999 Unspecified external cause status: Secondary | ICD-10-CM | POA: Insufficient documentation

## 2019-02-19 DIAGNOSIS — S80811A Abrasion, right lower leg, initial encounter: Secondary | ICD-10-CM | POA: Insufficient documentation

## 2019-02-19 DIAGNOSIS — F319 Bipolar disorder, unspecified: Secondary | ICD-10-CM | POA: Insufficient documentation

## 2019-02-19 DIAGNOSIS — Z3A14 14 weeks gestation of pregnancy: Secondary | ICD-10-CM | POA: Insufficient documentation

## 2019-02-19 DIAGNOSIS — S8992XA Unspecified injury of left lower leg, initial encounter: Secondary | ICD-10-CM | POA: Diagnosis not present

## 2019-02-19 DIAGNOSIS — R1030 Lower abdominal pain, unspecified: Secondary | ICD-10-CM | POA: Diagnosis not present

## 2019-02-19 DIAGNOSIS — Y929 Unspecified place or not applicable: Secondary | ICD-10-CM | POA: Diagnosis not present

## 2019-02-19 DIAGNOSIS — O9989 Other specified diseases and conditions complicating pregnancy, childbirth and the puerperium: Secondary | ICD-10-CM | POA: Diagnosis not present

## 2019-02-19 DIAGNOSIS — O26892 Other specified pregnancy related conditions, second trimester: Secondary | ICD-10-CM | POA: Diagnosis not present

## 2019-02-19 DIAGNOSIS — O2692 Pregnancy related conditions, unspecified, second trimester: Secondary | ICD-10-CM | POA: Insufficient documentation

## 2019-02-19 DIAGNOSIS — R52 Pain, unspecified: Secondary | ICD-10-CM | POA: Diagnosis not present

## 2019-02-19 DIAGNOSIS — S80812A Abrasion, left lower leg, initial encounter: Secondary | ICD-10-CM | POA: Diagnosis not present

## 2019-02-19 MED ORDER — ACETAMINOPHEN 500 MG PO TABS
1000.0000 mg | ORAL_TABLET | Freq: Once | ORAL | Status: AC
  Administered 2019-02-20: 1000 mg via ORAL
  Filled 2019-02-19: qty 2
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
## 2019-02-20 ENCOUNTER — Emergency Department (HOSPITAL_COMMUNITY): Payer: BLUE CROSS/BLUE SHIELD

## 2019-02-20 ENCOUNTER — Encounter (HOSPITAL_COMMUNITY): Payer: Self-pay

## 2019-02-20 ENCOUNTER — Ambulatory Visit (HOSPITAL_COMMUNITY): Payer: Self-pay

## 2019-02-20 ENCOUNTER — Other Ambulatory Visit (HOSPITAL_COMMUNITY): Payer: Self-pay

## 2019-02-20 ENCOUNTER — Ambulatory Visit (HOSPITAL_COMMUNITY): Admission: RE | Admit: 2019-02-20 | Payer: BLUE CROSS/BLUE SHIELD | Source: Ambulatory Visit

## 2019-02-20 DIAGNOSIS — O9989 Other specified diseases and conditions complicating pregnancy, childbirth and the puerperium: Secondary | ICD-10-CM | POA: Diagnosis not present

## 2019-02-20 DIAGNOSIS — S8992XA Unspecified injury of left lower leg, initial encounter: Secondary | ICD-10-CM | POA: Diagnosis not present

## 2019-02-20 DIAGNOSIS — Z3A14 14 weeks gestation of pregnancy: Secondary | ICD-10-CM | POA: Diagnosis not present

## 2019-02-20 HISTORY — DX: Unspecified ovarian cyst, unspecified side: N83.209

## 2019-02-20 HISTORY — DX: Unspecified abnormal cytological findings in specimens from vagina: R87.629

## 2019-02-20 LAB — URINALYSIS, ROUTINE W REFLEX MICROSCOPIC
BILIRUBIN URINE: NEGATIVE
Glucose, UA: NEGATIVE mg/dL
Hgb urine dipstick: NEGATIVE
Ketones, ur: 20 mg/dL — AB
Leukocytes,Ua: NEGATIVE
Nitrite: NEGATIVE
Protein, ur: NEGATIVE mg/dL
Specific Gravity, Urine: 1.006 (ref 1.005–1.030)
pH: 6 (ref 5.0–8.0)

## 2019-02-20 LAB — CK: Total CK: 61 U/L (ref 38–234)
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
## 2019-02-20 NOTE — ED Notes (Signed)
Patient refused last set of vitals, states "I'm fine I'm ready to go home". This RN attempted to go over discharge instructions and patient states " I can read those just like you can, can I have the papers?". This RN gave paper discharge papers and asked if she had any questions for the doctor. Patient denied any questions at this time.

## 2019-02-20 NOTE — ED Notes (Signed)
Patient up walking around the nurses station requesting her d/c papers to go home. States "I've been here for 4 hours and I need to go home and let my dogs out. I don't know why it takes this long for someone to read my pee".   Patient informed of delayed and asked to wait inside of her room at this time. Patient continues to stand at the doorway and states "I'm inside my room".

## 2019-02-23 ENCOUNTER — Emergency Department (HOSPITAL_BASED_OUTPATIENT_CLINIC_OR_DEPARTMENT_OTHER): Payer: BLUE CROSS/BLUE SHIELD

## 2019-02-23 ENCOUNTER — Other Ambulatory Visit: Payer: Self-pay

## 2019-02-23 ENCOUNTER — Emergency Department (HOSPITAL_COMMUNITY)
Admission: EM | Admit: 2019-02-23 | Discharge: 2019-02-23 | Disposition: A | Discharge status: Home / Self Care | Payer: BLUE CROSS/BLUE SHIELD | Attending: Emergency Medicine | Admitting: Emergency Medicine

## 2019-02-23 ENCOUNTER — Encounter (HOSPITAL_COMMUNITY): Payer: Self-pay

## 2019-02-23 DIAGNOSIS — Y92410 Unspecified street and highway as the place of occurrence of the external cause: Secondary | ICD-10-CM | POA: Insufficient documentation

## 2019-02-23 DIAGNOSIS — R52 Pain, unspecified: Secondary | ICD-10-CM

## 2019-02-23 DIAGNOSIS — Y9389 Activity, other specified: Secondary | ICD-10-CM | POA: Diagnosis not present

## 2019-02-23 DIAGNOSIS — O9989 Other specified diseases and conditions complicating pregnancy, childbirth and the puerperium: Secondary | ICD-10-CM | POA: Insufficient documentation

## 2019-02-23 DIAGNOSIS — M79605 Pain in left leg: Secondary | ICD-10-CM | POA: Insufficient documentation

## 2019-02-23 DIAGNOSIS — Y999 Unspecified external cause status: Secondary | ICD-10-CM | POA: Insufficient documentation

## 2019-02-23 DIAGNOSIS — Z3A13 13 weeks gestation of pregnancy: Secondary | ICD-10-CM | POA: Diagnosis not present

## 2019-02-23 DIAGNOSIS — S8010XA Contusion of unspecified lower leg, initial encounter: Secondary | ICD-10-CM | POA: Diagnosis not present

## 2019-02-23 NOTE — Discharge Instructions (Addendum)
You are seen in the emergency department today for continued left lower extremity pain.  Your ultrasound was negative for DVT (blood clot). Please continue your at home regimen including Tylenol, rest, ice, and elevation.  Please follow-up with primary care in 3 to 5 days.  Return to the ER for new or worsening symptoms including but not limited to increased pain, pain with movement of the ankle or knee, redness, fever, persistent numbness, weakness, or any other concerns.
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
## 2019-02-23 NOTE — Progress Notes (Signed)
Left lower extremity venous duplex completed. Preliminary results in Chart review CV Proc. [place] Marabella Popiel,RVS 02/23/2019 5:03 PM

## 2019-02-24 DIAGNOSIS — F3181 Bipolar II disorder: Secondary | ICD-10-CM | POA: Diagnosis not present

## 2019-02-25 ENCOUNTER — Other Ambulatory Visit: Payer: Self-pay

## 2019-02-25 ENCOUNTER — Ambulatory Visit (HOSPITAL_COMMUNITY): Payer: BLUE CROSS/BLUE SHIELD

## 2019-02-25 ENCOUNTER — Ambulatory Visit (HOSPITAL_COMMUNITY): Payer: BLUE CROSS/BLUE SHIELD | Attending: Obstetrics and Gynecology

## 2019-02-25 DIAGNOSIS — Z8481 Family history of carrier of genetic disease: Secondary | ICD-10-CM

## 2019-02-25 NOTE — Progress Notes (Signed)
Patient in session with Genetic Counselor, Lauren Taylor. 

## 2019-03-02 DIAGNOSIS — M25572 Pain in left ankle and joints of left foot: Secondary | ICD-10-CM | POA: Diagnosis not present

## 2019-03-03 DIAGNOSIS — F3181 Bipolar II disorder: Secondary | ICD-10-CM | POA: Diagnosis not present

## 2019-03-05 DIAGNOSIS — M79662 Pain in left lower leg: Secondary | ICD-10-CM | POA: Diagnosis not present

## 2019-03-06 ENCOUNTER — Ambulatory Visit (HOSPITAL_COMMUNITY): Payer: Self-pay | Admitting: Nurse Practitioner

## 2019-03-06 LAB — HGB FRAC. W/SOLUBILITY
Hgb A2 Quant: 2.7 % (ref 1.8–3.2)
Hgb A: 97.3 % (ref 96.4–98.8)
Hgb C: 0 %
Hgb F Quant: 0 % (ref 0.0–2.0)
Hgb S: 0 %
Hgb Solubility: NEGATIVE
Hgb Variant: 0 %

## 2019-03-06 LAB — MATERNIT 21 PLUS CORE, BLOOD
Fetal Fraction: 13
Result (T21): NEGATIVE
TRISOMY 13: NEGATIVE
Trisomy 18 (Edwards syndrome): NEGATIVE
Trisomy 21 (Down syndrome): NEGATIVE

## 2019-03-06 LAB — INHERITEST CORE(CF97,SMA,FRAX)

## 2019-03-09 DIAGNOSIS — Z363 Encounter for antenatal screening for malformations: Secondary | ICD-10-CM | POA: Diagnosis not present

## 2019-03-09 DIAGNOSIS — Z36 Encounter for antenatal screening for chromosomal anomalies: Secondary | ICD-10-CM | POA: Diagnosis not present

## 2019-03-11 DIAGNOSIS — F3181 Bipolar II disorder: Secondary | ICD-10-CM | POA: Diagnosis not present

## 2019-03-17 DIAGNOSIS — F3181 Bipolar II disorder: Secondary | ICD-10-CM | POA: Diagnosis not present

## 2019-03-18 DIAGNOSIS — M79662 Pain in left lower leg: Secondary | ICD-10-CM | POA: Diagnosis not present

## 2019-03-26 DIAGNOSIS — F3181 Bipolar II disorder: Secondary | ICD-10-CM | POA: Diagnosis not present

## 2019-04-02 DIAGNOSIS — F3181 Bipolar II disorder: Secondary | ICD-10-CM | POA: Diagnosis not present

## 2019-04-06 DIAGNOSIS — M79662 Pain in left lower leg: Secondary | ICD-10-CM | POA: Diagnosis not present

## 2019-04-09 DIAGNOSIS — Z3402 Encounter for supervision of normal first pregnancy, second trimester: Secondary | ICD-10-CM | POA: Diagnosis not present

## 2019-04-09 DIAGNOSIS — N760 Acute vaginitis: Secondary | ICD-10-CM | POA: Diagnosis not present

## 2019-04-09 DIAGNOSIS — F3181 Bipolar II disorder: Secondary | ICD-10-CM | POA: Diagnosis not present

## 2019-04-09 DIAGNOSIS — F319 Bipolar disorder, unspecified: Secondary | ICD-10-CM | POA: Diagnosis not present

## 2019-04-09 DIAGNOSIS — Z789 Other specified health status: Secondary | ICD-10-CM | POA: Diagnosis not present

## 2019-04-11 DIAGNOSIS — O99322 Drug use complicating pregnancy, second trimester: Secondary | ICD-10-CM | POA: Diagnosis not present

## 2019-04-13 DIAGNOSIS — M6281 Muscle weakness (generalized): Secondary | ICD-10-CM | POA: Diagnosis not present

## 2019-04-13 DIAGNOSIS — M25561 Pain in right knee: Secondary | ICD-10-CM | POA: Diagnosis not present

## 2019-04-13 DIAGNOSIS — M25562 Pain in left knee: Secondary | ICD-10-CM | POA: Diagnosis not present

## 2019-04-17 DIAGNOSIS — F3181 Bipolar II disorder: Secondary | ICD-10-CM | POA: Diagnosis not present

## 2019-04-21 DIAGNOSIS — F3181 Bipolar II disorder: Secondary | ICD-10-CM | POA: Diagnosis not present

## 2019-04-26 ENCOUNTER — Emergency Department (HOSPITAL_COMMUNITY): Payer: BLUE CROSS/BLUE SHIELD

## 2019-04-26 ENCOUNTER — Other Ambulatory Visit: Payer: Self-pay

## 2019-04-26 ENCOUNTER — Inpatient Hospital Stay (HOSPITAL_COMMUNITY)
Admission: AD | Admit: 2019-04-26 | Discharge: 2019-04-26 | Disposition: A | Discharge status: Home / Self Care | Payer: BLUE CROSS/BLUE SHIELD | Source: Home / Self Care | Attending: Obstetrics and Gynecology | Admitting: Obstetrics and Gynecology

## 2019-04-26 ENCOUNTER — Encounter (HOSPITAL_COMMUNITY): Payer: Self-pay | Admitting: *Deleted

## 2019-04-26 ENCOUNTER — Emergency Department (HOSPITAL_COMMUNITY)
Admission: EM | Admit: 2019-04-26 | Discharge: 2019-04-26 | Disposition: A | Discharge status: Home / Self Care | Payer: BLUE CROSS/BLUE SHIELD | Attending: Emergency Medicine | Admitting: Emergency Medicine

## 2019-04-26 ENCOUNTER — Emergency Department (HOSPITAL_BASED_OUTPATIENT_CLINIC_OR_DEPARTMENT_OTHER): Payer: BLUE CROSS/BLUE SHIELD

## 2019-04-26 DIAGNOSIS — M79672 Pain in left foot: Secondary | ICD-10-CM | POA: Insufficient documentation

## 2019-04-26 DIAGNOSIS — M79609 Pain in unspecified limb: Secondary | ICD-10-CM

## 2019-04-26 DIAGNOSIS — M7989 Other specified soft tissue disorders: Secondary | ICD-10-CM | POA: Diagnosis not present
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
## 2019-04-26 NOTE — MAU Note (Signed)
Pt reports left foot pain for the past 3 days.  Pt states she is unable to hold weight on her left foot. Pt denies any vag bleeding, LOF or abd. Pain and has no pregnancy related complaints.

## 2019-04-26 NOTE — ED Triage Notes (Signed)
PT reports 3 days of Lt lateral foot pain. Pt reports unable to stand on foot due to pain.

## 2019-04-26 NOTE — ED Notes (Signed)
Patient transported to X-ray 
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
## 2019-04-26 NOTE — Discharge Instructions (Signed)
Your evaluated today for foot pain.  Your x-ray was negative.  You do not have any evidence of infectious process on your exam.  You have no evidence of broken bones.  Your ultrasound was negative for blood clot.  This is likely what is called plantar fasciitis.  I have given you a sheet that discusses exercises for this.  I would suggest ice as well as Tylenol.  You cannot take anti-inflammatories as you are pregnant.  We have provided you with crutches to help with your walking.  Follow-up with orthopedics if you continue to have pain.

## 2019-04-26 NOTE — ED Notes (Signed)
Patient verbalizes understanding of discharge instructions . Opportunity for questions and answers were provided . Armband removed by staff ,Pt discharged from ED. W/C  offered at D/C  and Declined W/C at D/C and was escorted to lobby by RN.  

## 2019-04-26 NOTE — Progress Notes (Signed)
VASCULAR LAB PRELIMINARY  PRELIMINARY  PRELIMINARY  PRELIMINARY  Left lower extremity venous duplex completed.    Preliminary report:  See CV proc for preliminary report.  Gave report to [employer], PA-C  Nikki Rusnak, RVT 04/26/2019, 4:48 PM

## 2019-04-28 DIAGNOSIS — F3181 Bipolar II disorder: Secondary | ICD-10-CM | POA: Diagnosis not present

## 2019-04-28 DIAGNOSIS — Z3A23 23 weeks gestation of pregnancy: Secondary | ICD-10-CM | POA: Diagnosis not present

## 2019-04-28 DIAGNOSIS — N898 Other specified noninflammatory disorders of vagina: Secondary | ICD-10-CM | POA: Diagnosis not present

## 2019-04-28 DIAGNOSIS — Z363 Encounter for antenatal screening for malformations: Secondary | ICD-10-CM | POA: Diagnosis not present

## 2019-04-28 LAB — OB RESULTS CONSOLE ABO/RH: RH Type: POSITIVE

## 2019-04-28 LAB — OB RESULTS CONSOLE RPR: RPR: NONREACTIVE

## 2019-04-28 LAB — OB RESULTS CONSOLE GC/CHLAMYDIA
Chlamydia: NEGATIVE
Gonorrhea: NEGATIVE

## 2019-04-28 LAB — OB RESULTS CONSOLE RUBELLA ANTIBODY, IGM: Rubella: IMMUNE

## 2019-04-28 LAB — OB RESULTS CONSOLE HEPATITIS B SURFACE ANTIGEN: Hepatitis B Surface Ag: NEGATIVE

## 2019-04-28 LAB — OB RESULTS CONSOLE ANTIBODY SCREEN: Antibody Screen: NEGATIVE

## 2019-04-28 LAB — OB RESULTS CONSOLE HIV ANTIBODY (ROUTINE TESTING): HIV: NONREACTIVE

## 2019-05-05 DIAGNOSIS — F3181 Bipolar II disorder: Secondary | ICD-10-CM | POA: Diagnosis not present

## 2019-05-12 DIAGNOSIS — F3181 Bipolar II disorder: Secondary | ICD-10-CM | POA: Diagnosis not present

## 2019-05-19 DIAGNOSIS — F3181 Bipolar II disorder: Secondary | ICD-10-CM | POA: Diagnosis not present

## 2019-05-27 DIAGNOSIS — F3181 Bipolar II disorder: Secondary | ICD-10-CM | POA: Diagnosis not present

## 2019-06-01 DIAGNOSIS — Z3689 Encounter for other specified antenatal screening: Secondary | ICD-10-CM | POA: Diagnosis not present

## 2019-06-01 DIAGNOSIS — Z23 Encounter for immunization: Secondary | ICD-10-CM | POA: Diagnosis not present

## 2019-06-03 DIAGNOSIS — Z3A28 28 weeks gestation of pregnancy: Secondary | ICD-10-CM | POA: Diagnosis not present

## 2019-06-03 DIAGNOSIS — O368130 Decreased fetal movements, third trimester, not applicable or unspecified: Secondary | ICD-10-CM | POA: Diagnosis not present

## 2019-06-03 DIAGNOSIS — F3181 Bipolar II disorder: Secondary | ICD-10-CM | POA: Diagnosis not present

## 2019-06-10 DIAGNOSIS — F3181 Bipolar II disorder: Secondary | ICD-10-CM | POA: Diagnosis not present

## 2019-06-16 DIAGNOSIS — F3181 Bipolar II disorder: Secondary | ICD-10-CM | POA: Diagnosis not present

## 2019-06-24 DIAGNOSIS — F3181 Bipolar II disorder: Secondary | ICD-10-CM | POA: Diagnosis not present

## 2019-07-01 DIAGNOSIS — F3181 Bipolar II disorder: Secondary | ICD-10-CM | POA: Diagnosis not present

## 2019-07-02 DIAGNOSIS — D696 Thrombocytopenia, unspecified: Secondary | ICD-10-CM | POA: Diagnosis not present

## 2019-07-08 DIAGNOSIS — F3181 Bipolar II disorder: Secondary | ICD-10-CM | POA: Diagnosis not present

## 2019-07-15 DIAGNOSIS — F3181 Bipolar II disorder: Secondary | ICD-10-CM | POA: Diagnosis not present

## 2019-07-22 DIAGNOSIS — F3181 Bipolar II disorder: Secondary | ICD-10-CM | POA: Diagnosis not present

## 2019-07-24 DIAGNOSIS — Z3685 Encounter for antenatal screening for Streptococcus B: Secondary | ICD-10-CM | POA: Diagnosis not present

## 2019-07-24 LAB — OB RESULTS CONSOLE GBS: GBS: NEGATIVE

## 2019-08-05 DIAGNOSIS — F3181 Bipolar II disorder: Secondary | ICD-10-CM | POA: Diagnosis not present

## 2019-08-11 ENCOUNTER — Other Ambulatory Visit: Payer: Self-pay | Admitting: Advanced Practice Midwife

## 2019-08-12 ENCOUNTER — Ambulatory Visit (HOSPITAL_COMMUNITY)
Admission: RE | Admit: 2019-08-12 | Discharge: 2019-08-12 | Disposition: A | Discharge status: Home / Self Care | Payer: BC Managed Care – PPO | Source: Ambulatory Visit | Attending: Obstetrics and Gynecology | Admitting: Obstetrics and Gynecology

## 2019-08-12 ENCOUNTER — Telehealth (HOSPITAL_COMMUNITY): Payer: Self-pay | Admitting: *Deleted

## 2019-08-12 ENCOUNTER — Encounter (HOSPITAL_COMMUNITY): Payer: Self-pay | Admitting: *Deleted

## 2019-08-12 ENCOUNTER — Other Ambulatory Visit: Payer: Self-pay

## 2019-08-12 DIAGNOSIS — Z20828 Contact with and (suspected) exposure to other viral communicable diseases: Secondary | ICD-10-CM | POA: Insufficient documentation

## 2019-08-12 DIAGNOSIS — F3181 Bipolar II disorder: Secondary | ICD-10-CM | POA: Diagnosis not present

## 2019-08-12 LAB — SARS CORONAVIRUS 2 BY RT PCR (HOSPITAL ORDER, PERFORMED IN ~~LOC~~ HOSPITAL LAB): SARS Coronavirus 2: NEGATIVE

## 2019-08-12 NOTE — MAU Note (Signed)
Covid swab collected. Pt tolerated well. PT asymptomatic 

## 2019-08-12 NOTE — Telephone Encounter (Signed)
Preadmission screen  
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
## 2019-08-14 ENCOUNTER — Encounter (HOSPITAL_COMMUNITY): Payer: Self-pay

## 2019-08-14 ENCOUNTER — Inpatient Hospital Stay (HOSPITAL_COMMUNITY)
Admission: AD | Admit: 2019-08-14 | Discharge: 2019-08-16 | DRG: 807 | Disposition: A | Discharge status: Home / Self Care | Payer: BC Managed Care – PPO | Attending: Obstetrics and Gynecology | Admitting: Obstetrics and Gynecology

## 2019-08-14 ENCOUNTER — Inpatient Hospital Stay (HOSPITAL_COMMUNITY): Payer: BC Managed Care – PPO | Admitting: Anesthesiology

## 2019-08-14 ENCOUNTER — Inpatient Hospital Stay (HOSPITAL_COMMUNITY): Payer: BC Managed Care – PPO

## 2019-08-14 ENCOUNTER — Other Ambulatory Visit: Payer: Self-pay

## 2019-08-14 DIAGNOSIS — O26893 Other specified pregnancy related conditions, third trimester: Secondary | ICD-10-CM | POA: Diagnosis not present

## 2019-08-14 DIAGNOSIS — Z3490 Encounter for supervision of normal pregnancy, unspecified, unspecified trimester: Secondary | ICD-10-CM

## 2019-08-14 DIAGNOSIS — Z3A39 39 weeks gestation of pregnancy: Secondary | ICD-10-CM

## 2019-08-14 DIAGNOSIS — O9962 Diseases of the digestive system complicating childbirth: Principal | ICD-10-CM | POA: Diagnosis present

## 2019-08-14 DIAGNOSIS — Z86718 Personal history of other venous thrombosis and embolism: Secondary | ICD-10-CM | POA: Diagnosis not present

## 2019-08-14 DIAGNOSIS — K219 Gastro-esophageal reflux disease without esophagitis: Secondary | ICD-10-CM | POA: Diagnosis present

## 2019-08-14 DIAGNOSIS — Z20828 Contact with and (suspected) exposure to other viral communicable diseases: Secondary | ICD-10-CM | POA: Diagnosis present

## 2019-08-14 DIAGNOSIS — K449 Diaphragmatic hernia without obstruction or gangrene: Secondary | ICD-10-CM | POA: Diagnosis not present

## 2019-08-14 HISTORY — DX: Diaphragmatic hernia without obstruction or gangrene: K44.9

## 2019-08-14 LAB — ABO/RH: ABO/RH(D): A POS

## 2019-08-14 LAB — CBC
HCT: 38 % (ref 36.0–46.0)
Hemoglobin: 13.2 g/dL (ref 12.0–15.0)
MCH: 32.6 pg (ref 26.0–34.0)
MCHC: 34.7 g/dL (ref 30.0–36.0)
MCV: 93.8 fL (ref 80.0–100.0)
Platelets: 147 10*3/uL — ABNORMAL LOW (ref 150–400)
RBC: 4.05 MIL/uL (ref 3.87–5.11)
RDW: 12.8 % (ref 11.5–15.5)
WBC: 9.2 10*3/uL (ref 4.0–10.5)
nRBC: 0 % (ref 0.0–0.2)

## 2019-08-14 LAB — TYPE AND SCREEN
ABO/RH(D): A POS
Antibody Screen: NEGATIVE

## 2019-08-14 LAB — RPR: RPR Ser Ql: NONREACTIVE

## 2019-08-14 MED ORDER — LACTATED RINGERS IV SOLN
500.0000 mL | INTRAVENOUS | Status: DC | PRN
  Administered 2019-08-14: 500 mL via INTRAVENOUS

## 2019-08-14 MED ORDER — FENTANYL-BUPIVACAINE-NACL 0.5-0.125-0.9 MG/250ML-% EP SOLN
12.0000 mL/h | EPIDURAL | Status: DC | PRN
  Filled 2019-08-14: qty 250

## 2019-08-14 MED ORDER — TERBUTALINE SULFATE 1 MG/ML IJ SOLN: 0.2500 mg | Freq: Once | INTRAMUSCULAR | Status: DC | PRN

## 2019-08-14 MED ORDER — OXYTOCIN 40 UNITS IN NORMAL SALINE INFUSION - SIMPLE MED
2.5000 [IU]/h | INTRAVENOUS | Status: DC
  Filled 2019-08-14: qty 1000

## 2019-08-14 MED ORDER — BUTORPHANOL TARTRATE 1 MG/ML IJ SOLN: 1.0000 mg | INTRAMUSCULAR | Status: DC | PRN

## 2019-08-14 MED ORDER — LIDOCAINE HCL (PF) 1 % IJ SOLN
INTRAMUSCULAR | Status: DC | PRN
  Administered 2019-08-14 (×2): 4 mL via EPIDURAL

## 2019-08-14 MED ORDER — OXYTOCIN BOLUS FROM INFUSION
500.0000 mL | Freq: Once | INTRAVENOUS | Status: AC
  Administered 2019-08-15: 500 mL via INTRAVENOUS

## 2019-08-14 MED ORDER — ACETAMINOPHEN 325 MG PO TABS: 650.0000 mg | ORAL_TABLET | ORAL | Status: DC | PRN

## 2019-08-14 MED ORDER — SODIUM CHLORIDE (PF) 0.9 % IJ SOLN
INTRAMUSCULAR | Status: DC | PRN
  Administered 2019-08-14: 12 mL/h via EPIDURAL

## 2019-08-14 MED ORDER — EPHEDRINE 5 MG/ML INJ: 10.0000 mg | INTRAVENOUS | Status: DC | PRN

## 2019-08-14 MED ORDER — OXYCODONE-ACETAMINOPHEN 5-325 MG PO TABS: 1.0000 | ORAL_TABLET | ORAL | Status: DC | PRN

## 2019-08-14 MED ORDER — LIDOCAINE HCL (PF) 1 % IJ SOLN: 30.0000 mL | INTRAMUSCULAR | Status: DC | PRN

## 2019-08-14 MED ORDER — LACTATED RINGERS IV SOLN: 500.0000 mL | Freq: Once | INTRAVENOUS | Status: DC

## 2019-08-14 MED ORDER — LACTATED RINGERS IV SOLN
500.0000 mL | Freq: Once | INTRAVENOUS | Status: AC
  Administered 2019-08-14: 500 mL via INTRAVENOUS

## 2019-08-14 MED ORDER — LACTATED RINGERS IV SOLN
INTRAVENOUS | Status: DC
  Administered 2019-08-14 (×2): via INTRAVENOUS

## 2019-08-14 MED ORDER — DIPHENHYDRAMINE HCL 50 MG/ML IJ SOLN: 12.5000 mg | INTRAMUSCULAR | Status: DC | PRN

## 2019-08-14 MED ORDER — OXYTOCIN 40 UNITS IN NORMAL SALINE INFUSION - SIMPLE MED
1.0000 m[IU]/min | INTRAVENOUS | Status: DC
  Administered 2019-08-14: 2 m[IU]/min via INTRAVENOUS
  Administered 2019-08-14: 10 m[IU]/min via INTRAVENOUS
  Administered 2019-08-14: 8 m[IU]/min via INTRAVENOUS
  Administered 2019-08-14: 6 m[IU]/min via INTRAVENOUS
  Administered 2019-08-14: 12 m[IU]/min via INTRAVENOUS
  Administered 2019-08-14: 4 m[IU]/min via INTRAVENOUS

## 2019-08-14 MED ORDER — SOD CITRATE-CITRIC ACID 500-334 MG/5ML PO SOLN
30.0000 mL | ORAL | Status: DC | PRN
  Administered 2019-08-14 (×2): 30 mL via ORAL
  Filled 2019-08-14 (×2): qty 30

## 2019-08-14 MED ORDER — OXYCODONE-ACETAMINOPHEN 5-325 MG PO TABS: 2.0000 | ORAL_TABLET | ORAL | Status: DC | PRN

## 2019-08-14 MED ORDER — ONDANSETRON HCL 4 MG/2ML IJ SOLN: 4.0000 mg | Freq: Four times a day (QID) | INTRAMUSCULAR | Status: DC | PRN

## 2019-08-14 MED ORDER — PHENYLEPHRINE 40 MCG/ML (10ML) SYRINGE FOR IV PUSH (FOR BLOOD PRESSURE SUPPORT): 80.0000 ug | PREFILLED_SYRINGE | INTRAVENOUS | Status: DC | PRN

## 2019-08-14 NOTE — Anesthesia Procedure Notes (Signed)
Epidural Patient location during procedure: OB Start time: 08/14/2019 8:23 PM End time: 08/14/2019 8:26 PM  Staffing Anesthesiologist: Brennan Bailey, MD Performed: anesthesiologist   Preanesthetic Checklist Completed: patient identified, pre-op evaluation, timeout performed, IV checked, risks and benefits discussed and monitors and equipment checked  Epidural Patient position: sitting Prep: site prepped and draped and DuraPrep Patient monitoring: continuous pulse ox, blood pressure and heart rate Approach: midline Location: L3-L4 Injection technique: LOR air  Needle:  Needle type: Tuohy  Needle gauge: 17 G Needle length: 9 cm Needle insertion depth: 5 cm Catheter type: closed end flexible Catheter size: 19 Gauge Catheter at skin depth: 10 cm Test dose: negative and Other (1% lidocaine)  Assessment Events: blood not aspirated, injection not painful, no injection resistance, negative IV test and no paresthesia  Additional Notes Patient identified. Risks, benefits, and alternatives discussed with patient including but not limited to bleeding, infection, nerve damage, paralysis, failed block, incomplete pain control, headache, blood pressure changes, nausea, vomiting, reactions to medication, itching, and postpartum back pain. Confirmed with bedside nurse the patient's most recent platelet count. Confirmed with patient that they are not currently taking any anticoagulation, have any bleeding history, or any family history of bleeding disorders. Patient expressed understanding and wished to proceed. All questions were answered. Sterile technique was used throughout the entire procedure. Please see nursing notes for vital signs.   Crisp LOR on first pass. Test dose was given through epidural catheter and negative prior to continuing to dose epidural or start infusion. Warning signs of high block given to the patient including shortness of breath, tingling/numbness in hands, complete  motor block, or any concerning symptoms with instructions to call for help. Patient was given instructions on fall risk and not to get out of bed. All questions and concerns addressed with instructions to call with any issues or inadequate analgesia.  Reason for block:procedure for pain

## 2019-08-14 NOTE — Progress Notes (Signed)
Strip note:  Cat 1 tracing with baseline 135, mod var, + accel, occ early and variable decels Toco q2-4 Last CE 7/90/0 @ 1900 per RN  VSS, no epidural at this time  Anticipate SVD

## 2019-08-14 NOTE — Anesthesia Preprocedure Evaluation (Signed)
Anesthesia Evaluation  Patient identified by MRN, date of birth, ID band Patient awake    Reviewed: Allergy & Precautions, Patient's Chart, lab work & pertinent test results  History of Anesthesia Complications Negative for: history of anesthetic complications  Airway Mallampati: II  TM Distance: >3 FB Neck ROM: Full    Dental no notable dental hx.    Pulmonary neg pulmonary ROS,    Pulmonary exam normal        Cardiovascular negative cardio ROS Normal cardiovascular exam     Neuro/Psych Anxiety Bipolar Disorder negative neurological ROS     GI/Hepatic Neg liver ROS, hiatal hernia, GERD  Controlled and Medicated,  Endo/Other  negative endocrine ROS  Renal/GU negative Renal ROS     Musculoskeletal negative musculoskeletal ROS (+)   Abdominal   Peds  Hematology  (+) anemia ,   Anesthesia Other Findings Day of surgery medications reviewed with the patient.  Reproductive/Obstetrics (+) Pregnancy Gestational thrombocytopenia, plts 147                             Anesthesia Physical Anesthesia Plan  ASA: II  Anesthesia Plan: Epidural   Post-op Pain Management:    Induction:   PONV Risk Score and Plan: Treatment may vary due to age or medical condition  Airway Management Planned: Natural Airway  Additional Equipment:   Intra-op Plan:   Post-operative Plan:   Informed Consent: I have reviewed the patients History and Physical, chart, labs and discussed the procedure including the risks, benefits and alternatives for the proposed anesthesia with the patient or authorized representative who has indicated his/her understanding and acceptance.       Plan Discussed with:   Anesthesia Plan Comments:         Anesthesia Quick Evaluation
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
## 2019-08-14 NOTE — Progress Notes (Signed)
Patient seen and evaluated. CE 2/70/-1, unchanged from in-office exam on Monday. Plan to initiate pitocin and titrate per protocol. Declining epidural at this time. Labs pending this AM in order to evaluate known TCP  Patient complaining of GERD< Rx given. LLE DVT evaluated (first time seeing patient, approx 0.5x1cm on anterior tibia, NTTP, no surrounding erythema/induration).   GBS neg. Anticipate SVD of baby girl

## 2019-08-15 ENCOUNTER — Encounter (HOSPITAL_COMMUNITY): Payer: Self-pay | Admitting: Obstetrics and Gynecology

## 2019-08-15 LAB — CBC
HCT: 33.3 % — ABNORMAL LOW (ref 36.0–46.0)
Hemoglobin: 11.6 g/dL — ABNORMAL LOW (ref 12.0–15.0)
MCH: 32.5 pg (ref 26.0–34.0)
MCHC: 34.8 g/dL (ref 30.0–36.0)
MCV: 93.3 fL (ref 80.0–100.0)
Platelets: 131 10*3/uL — ABNORMAL LOW (ref 150–400)
RBC: 3.57 MIL/uL — ABNORMAL LOW (ref 3.87–5.11)
RDW: 12.9 % (ref 11.5–15.5)
WBC: 19 10*3/uL — ABNORMAL HIGH (ref 4.0–10.5)
nRBC: 0 % (ref 0.0–0.2)

## 2019-08-15 MED ORDER — DIBUCAINE (PERIANAL) 1 % EX OINT: 1.0000 "application " | TOPICAL_OINTMENT | CUTANEOUS | Status: DC | PRN

## 2019-08-15 MED ORDER — BENZOCAINE-MENTHOL 20-0.5 % EX AERO: 1.0000 "application " | INHALATION_SPRAY | CUTANEOUS | Status: DC | PRN

## 2019-08-15 MED ORDER — WITCH HAZEL-GLYCERIN EX PADS: 1.0000 "application " | MEDICATED_PAD | CUTANEOUS | Status: DC | PRN

## 2019-08-15 MED ORDER — PANTOPRAZOLE SODIUM 40 MG PO TBEC: 40.0000 mg | DELAYED_RELEASE_TABLET | Freq: Every day | ORAL | Status: DC

## 2019-08-15 MED ORDER — SENNOSIDES-DOCUSATE SODIUM 8.6-50 MG PO TABS
2.0000 | ORAL_TABLET | ORAL | Status: DC
  Filled 2019-08-15: qty 2

## 2019-08-15 MED ORDER — ZOLPIDEM TARTRATE 5 MG PO TABS: 5.0000 mg | ORAL_TABLET | Freq: Every evening | ORAL | Status: DC | PRN

## 2019-08-15 MED ORDER — DIPHENHYDRAMINE HCL 25 MG PO CAPS: 25.0000 mg | ORAL_CAPSULE | Freq: Four times a day (QID) | ORAL | Status: DC | PRN

## 2019-08-15 MED ORDER — ACETAMINOPHEN 325 MG PO TABS: 650.0000 mg | ORAL_TABLET | ORAL | Status: DC | PRN

## 2019-08-15 MED ORDER — TETANUS-DIPHTH-ACELL PERTUSSIS 5-2.5-18.5 LF-MCG/0.5 IM SUSP: 0.5000 mL | Freq: Once | INTRAMUSCULAR | Status: DC

## 2019-08-15 MED ORDER — IBUPROFEN 600 MG PO TABS
600.0000 mg | ORAL_TABLET | Freq: Four times a day (QID) | ORAL | Status: DC
  Administered 2019-08-15 – 2019-08-16 (×4): 600 mg via ORAL
  Filled 2019-08-15 (×5): qty 1

## 2019-08-15 MED ORDER — SIMETHICONE 80 MG PO CHEW: 80.0000 mg | CHEWABLE_TABLET | ORAL | Status: DC | PRN

## 2019-08-15 MED ORDER — COCONUT OIL OIL: 1.0000 "application " | TOPICAL_OIL | Status: DC | PRN

## 2019-08-15 MED ORDER — ONDANSETRON HCL 4 MG PO TABS: 4.0000 mg | ORAL_TABLET | ORAL | Status: DC | PRN

## 2019-08-15 MED ORDER — IBUPROFEN 600 MG PO TABS: 600.0000 mg | ORAL_TABLET | Freq: Four times a day (QID) | ORAL | 1 refills | Status: DC

## 2019-08-15 MED ORDER — PRENATAL MULTIVITAMIN CH
1.0000 | ORAL_TABLET | Freq: Every day | ORAL | Status: DC
  Administered 2019-08-15: 1 via ORAL
  Filled 2019-08-15: qty 1

## 2019-08-15 MED ORDER — ONDANSETRON HCL 4 MG/2ML IJ SOLN: 4.0000 mg | INTRAMUSCULAR | Status: DC | PRN

## 2019-08-15 NOTE — Lactation Note (Signed)
This note was copied from a baby's chart. Lactation Consultation Note  Patient Name: Megan Owens TEIHD'T Date: 08/15/2019  P1, 4 hour female infant. LC entered room mom and infant asleep.  Maternal Data    Feeding    LATCH Score                   Interventions    Lactation Tools Discussed/Used     Consult Status      Vicente Serene 08/15/2019, 5:08 AM
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
## 2019-08-15 NOTE — Progress Notes (Signed)
Patient asked MD while this RN @ 1130 in room to not wear SCD's, MD said yes.

## 2019-08-15 NOTE — Progress Notes (Signed)
Brief note  Patient seen at bedside, working on infant latch around lunch time. Light lochia, ambulating, voiding and tolerating PO without issue. Pain well controlled. Routine pp care at this time  BP (!) 111/56 (BP Location: Left Arm)   Pulse 89   Temp 98.6 F (37 C) (Oral)   Resp 19   Ht 5\' 5"  (1.651 m)   Wt 73 kg   LMP 10/29/2018   SpO2 100%   Breastfeeding Unknown   BMI 26.79 kg/m

## 2019-08-15 NOTE — Anesthesia Postprocedure Evaluation (Signed)
Anesthesia Post Note  Patient: Megan Owens  Procedure(s) Performed: AN AD [place]     Patient location during evaluation: Mother Baby Anesthesia Type: Epidural Level of consciousness: awake Pain management: satisfactory to patient Vital Signs Assessment: post-procedure vital signs reviewed and stable Respiratory status: spontaneous breathing Cardiovascular status: stable Anesthetic complications: no    Last Vitals:  Vitals:   08/15/19 0400 08/15/19 0752  BP: 119/75 (!) 111/56  Pulse: (!) 101 89  Resp: 18 19  Temp: 37.3 C 37 C  SpO2: 100%     Last Pain:  Vitals:   08/15/19 0753  TempSrc:   PainSc: 0-No pain   Pain Goal:                   [employer]

## 2019-08-15 NOTE — Lactation Note (Signed)
This note was copied from a baby's chart. Lactation Consultation Note  Patient Name: Megan Owens OFBPZ'W Date: 08/15/2019 Reason for consult: Initial assessment;1st time breastfeeding;Early term 37-38.6wks P1, 6 hour female infant. Per mom, infant latched well in L&D for 30 minutes but not tried latching in room family was very tired and slept over 5 hours without feeding infant. Infant had 2 stools since birth. LC discussed with mom to feed infant according hunger cues, 8 to 12 times within 24 hours on demand. Mom attempted to latch infant, infant would only hold breast in mouth and not suckle at this time. Mom taught back hand expression and infant was given 5 ml of colostrum by spoon. Mom knows if infant is reluctant to latch to do STS and hand express to give back volume. Mom will continue to work towards infant latching to breast.  Mom knows to call Nurse or [place] if she has any questions, concerns or need assistance with latching infant to breast. Reviewed Baby & Me book's Breastfeeding Basics.  Mom made aware of O/P services, breastfeeding support groups, community resources, and our phone # for post-discharge questions.   Maternal Data Formula Feeding for Exclusion: No Has patient been taught Hand Expression?: Yes(Mom hand expressed 5 ml of colostrum that was spoon fed to infant.) Does the patient have breastfeeding experience prior to this delivery?: No  Feeding Feeding Type: Breast Fed  LATCH Score Latch: Too sleepy or reluctant, no latch achieved, no sucking elicited.  Audible Swallowing: None  Type of Nipple: Everted at rest and after stimulation  Comfort (Breast/Nipple): Soft / non-tender  Hold (Positioning): Assistance needed to correctly position infant at breast and maintain latch.  LATCH Score: 5  Interventions Interventions: Breast feeding basics reviewed;Breast compression;Adjust position;Assisted with latch;Skin to skin;Support pillows;Breast  massage;Position options;Hand express;Expressed milk  Lactation Tools Discussed/Used WIC Program: Yes   Consult Status Consult Status: Follow-up Date: 08/15/19 Follow-up type: In-patient    Vicente Serene 08/15/2019, 7:18 AM

## 2019-08-15 NOTE — Discharge Summary (Signed)
OB Discharge Summary     Patient Name: Megan Owens DOB: [birth year] MRN: [id number]  Date of admission: 08/14/2019 Delivering MD: Ellison Hughs M   Date of discharge: 08/16/2019  Admitting diagnosis: preg Intrauterine pregnancy: [redacted]w[redacted]d     Secondary diagnosis:  Active Problems:   Term pregnancy   SVD (spontaneous vaginal delivery)  Additional problems: Idiopathic TCP     Discharge diagnosis: Term Pregnancy Delivered                                                                                                Post partum procedures:none  Augmentation: AROM and Pitocin  Complications: None  Hospital course:  Induction of Labor With Vaginal Delivery   28 y.o. yo G1P1001 at [redacted]w[redacted]d was admitted to the hospital 08/14/2019 for induction of labor.  Indication for induction: Favorable cervix at term.  Patient had an uncomplicated labor course as follows: Membrane Rupture Time/Date: 12:02 PM ,08/14/2019   Intrapartum Procedures: Episiotomy: None [1]                                         Lacerations:  2nd degree [3];Periurethral [8];Sulcus [9]  Patient had delivery of a Viable infant.  Information for the patient's newborn:  Dalton, Ealy [[id number]]  Delivery Method: Vag-Spont(filed from delivery)    08/15/2019  Details of delivery can be found in separate delivery note.  Patient had a routine postpartum course. Patient is discharged home 08/16/19.  Physical exam  Vitals:   08/15/19 1200 08/15/19 1500 08/15/19 2100 08/16/19 0500  BP: 104/61 106/69 (!) 106/58 104/67  Pulse: 79 81 74 72  Resp: 18 18 16 17   Temp: 98 F (36.7 C) 98.3 F (36.8 C) 98.1 F (36.7 C) 97.9 F (36.6 C)  TempSrc: Oral Oral Oral Oral  SpO2:      Weight:      Height:       General: alert, cooperative and no distress Lochia: appropriate Uterine Fundus: firm Incision: N/A DVT Evaluation: Negative Homan's sign. No cords or calf tenderness. Labs: Lab Results  Component Value Date   WBC  19.0 (H) 08/15/2019   HGB 11.6 (L) 08/15/2019   HCT 33.3 (L) 08/15/2019   MCV 93.3 08/15/2019   PLT 131 (L) 08/15/2019   CMP Latest Ref Rng & Units 07/22/2017  Glucose 65 - 99 mg/dL 244(W)  BUN 6 - 20 mg/dL 18  Creatinine 1.02 - 7.25 mg/dL 3.66  Sodium 440 - 347 mmol/L 136  Potassium 3.5 - 5.1 mmol/L 3.2(L)  Chloride 101 - 111 mmol/L 105  CO2 22 - 32 mmol/L 18(L)  Calcium 8.9 - 10.3 mg/dL 9.8  Total Protein 6.5 - 8.1 g/dL 8.0  Total Bilirubin 0.3 - 1.2 mg/dL 0.6  Alkaline Phos 38 - 126 U/L 61  AST 15 - 41 U/L 25  ALT 14 - 54 U/L 15    Discharge instruction: per After Visit Summary and "Baby and Me Booklet".  After visit meds:  Allergies as of  08/16/2019   No Active Allergies     Medication List    STOP taking these medications   acetaminophen 500 MG tablet Commonly known as: TYLENOL   valACYclovir 500 MG tablet Commonly known as: VALTREX     TAKE these medications   ibuprofen 600 MG tablet Commonly known as: ADVIL Take 1 tablet (600 mg total) by mouth 4 (four) times daily.   pantoprazole 40 MG tablet Commonly known as: PROTONIX Take 40 mg by mouth daily.   prenatal multivitamin Tabs tablet Take 1 tablet by mouth daily at 12 noon.       Diet: routine diet  Activity: Advance as tolerated. Pelvic rest for 6 weeks.   Outpatient follow up:6 weeks Follow up Appt:No future appointments. Follow up Visit:No follow-ups on file.  Postpartum contraception: OCPS at 6wks, condoms in interim, considering IUD in future  Newborn Data: Live born female  Birth Weight: 7 lb 13.2 oz (3549 g) APGAR: 8, 9  Newborn Delivery   Birth date/time: 08/15/2019 00:33:00 Delivery type: Vaginal, Spontaneous      Baby Feeding: Breast Disposition:home with mother   08/16/2019 Carlisle Cater, MD

## 2019-08-16 NOTE — Lactation Note (Signed)
This note was copied from a baby's chart. Lactation Consultation Note  Patient Name: Megan Owens [id number]'M Date: 08/16/2019  P1, 33 hour female infant. Infant had 2 voids and 4 stools. Per mom, infant is latching well and most feedings are 20 to 30 minutes each. Mom is breastfeeding infant according hunger cues, 8 to 12 times  and on demand. LC discussed infant may cluster fed  after 24 hours of life.  Parents are continue to do STS. Mom knows to call Nurse or [place] if she has any questions , concerns or need assistance with latching infant to breast.     Maternal Data    Feeding Feeding Type: Breast Fed  LATCH Score                   Interventions    Lactation Tools Discussed/Used     Consult Status      Vicente Serene 08/16/2019, 2:10 AM

## 2019-08-16 NOTE — Progress Notes (Signed)
CSW received consult for MOB's history of anxiety and bipolar. CSW spoke with MOB via telephone to complete assessment. 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 MOB reports that FOB is involved and has been present with her at [facility] until he left this morning to go home to check on the couple's dogs. MOB reports she has great family and friend support outside of the hospital. MOB reports she has a car seat for safe transportation of the infant. MOB reports the infant will sleep in a bassinet in the parent's room until being moved to her own room. SIDS precautiions were thoroughly reviewed with MOB. MOB reports she is not on any psychotropic medications for her mental health diagnoses but states she sees Ernest Pine at [place] and [facility] for outpatient therapy. MOB states she uses self coping mechanisms to deal with any symptoms that arise. MOB denies any mental health concerns at this time and states her mood has been stable since giving birth. MOB reports she receives [facility] and will get the baby certified on Tuesday when the office opens. CSW educated MOB on baby blues period versus postpartum depression and how to reach out for assistance if any needs arise. MOB did not have questions or concerns at this time and stated gratitude for CSW interaction.  Madilyn Fireman, MSW, LCSW-A Clinical Social Worker [facility] [phone]

## 2019-08-19 DIAGNOSIS — F3181 Bipolar II disorder: Secondary | ICD-10-CM | POA: Diagnosis not present

## 2019-09-16 DIAGNOSIS — F3181 Bipolar II disorder: Secondary | ICD-10-CM | POA: Diagnosis not present

## 2019-09-28 DIAGNOSIS — D696 Thrombocytopenia, unspecified: Secondary | ICD-10-CM | POA: Diagnosis not present

## 2019-09-29 DIAGNOSIS — Z30430 Encounter for insertion of intrauterine contraceptive device: Secondary | ICD-10-CM | POA: Diagnosis not present

## 2019-09-30 DIAGNOSIS — F3181 Bipolar II disorder: Secondary | ICD-10-CM | POA: Diagnosis not present

## 2019-10-02 DIAGNOSIS — Z30431 Encounter for routine checking of intrauterine contraceptive device: Secondary | ICD-10-CM | POA: Diagnosis not present

## 2019-10-05 DIAGNOSIS — Z30431 Encounter for routine checking of intrauterine contraceptive device: Secondary | ICD-10-CM | POA: Diagnosis not present

## 2019-11-09 DIAGNOSIS — N898 Other specified noninflammatory disorders of vagina: Secondary | ICD-10-CM | POA: Diagnosis not present

## 2019-11-12 DIAGNOSIS — H43393 Other vitreous opacities, bilateral: Secondary | ICD-10-CM | POA: Diagnosis not present

## 2019-11-12 DIAGNOSIS — H40033 Anatomical narrow angle, bilateral: Secondary | ICD-10-CM | POA: Diagnosis not present

## 2019-12-14 DIAGNOSIS — A5900 Urogenital trichomoniasis, unspecified: Secondary | ICD-10-CM | POA: Diagnosis not present

## 2019-12-22 DIAGNOSIS — J309 Allergic rhinitis, unspecified: Secondary | ICD-10-CM | POA: Diagnosis not present

## 2019-12-22 DIAGNOSIS — F3181 Bipolar II disorder: Secondary | ICD-10-CM | POA: Diagnosis not present

## 2019-12-22 DIAGNOSIS — J358 Other chronic diseases of tonsils and adenoids: Secondary | ICD-10-CM | POA: Diagnosis not present

## 2019-12-24 ENCOUNTER — Encounter (HOSPITAL_COMMUNITY): Payer: Self-pay | Admitting: Obstetrics and Gynecology

## 2019-12-28 DIAGNOSIS — F3181 Bipolar II disorder: Secondary | ICD-10-CM | POA: Diagnosis not present

## 2020-01-07 DIAGNOSIS — J358 Other chronic diseases of tonsils and adenoids: Secondary | ICD-10-CM | POA: Diagnosis not present

## 2020-01-11 DIAGNOSIS — Z30432 Encounter for removal of intrauterine contraceptive device: Secondary | ICD-10-CM | POA: Diagnosis not present

## 2020-01-11 DIAGNOSIS — Z3040 Encounter for surveillance of contraceptives, unspecified: Secondary | ICD-10-CM | POA: Diagnosis not present

## 2020-01-19 DIAGNOSIS — K219 Gastro-esophageal reflux disease without esophagitis: Secondary | ICD-10-CM | POA: Diagnosis not present

## 2020-01-19 DIAGNOSIS — J358 Other chronic diseases of tonsils and adenoids: Secondary | ICD-10-CM | POA: Diagnosis not present

## 2020-01-19 DIAGNOSIS — F3181 Bipolar II disorder: Secondary | ICD-10-CM | POA: Diagnosis not present

## 2020-01-23 DIAGNOSIS — F3181 Bipolar II disorder: Secondary | ICD-10-CM | POA: Diagnosis not present

## 2020-01-27 DIAGNOSIS — F3181 Bipolar II disorder: Secondary | ICD-10-CM | POA: Diagnosis not present

## 2020-02-03 DIAGNOSIS — F3181 Bipolar II disorder: Secondary | ICD-10-CM | POA: Diagnosis not present

## 2020-02-16 DIAGNOSIS — F3181 Bipolar II disorder: Secondary | ICD-10-CM | POA: Diagnosis not present

## 2020-03-01 DIAGNOSIS — F3181 Bipolar II disorder: Secondary | ICD-10-CM | POA: Diagnosis not present

## 2021-05-31 ENCOUNTER — Inpatient Hospital Stay (HOSPITAL_COMMUNITY): Payer: Medicaid Other

## 2021-05-31 ENCOUNTER — Other Ambulatory Visit: Payer: Self-pay

## 2021-05-31 ENCOUNTER — Inpatient Hospital Stay (HOSPITAL_COMMUNITY)
Admission: AD | Admit: 2021-05-31 | Discharge: 2021-06-01 | Disposition: A | Discharge status: Home / Self Care | Payer: Medicaid Other | Attending: Obstetrics and Gynecology | Admitting: Obstetrics and Gynecology

## 2021-05-31 ENCOUNTER — Encounter (HOSPITAL_COMMUNITY): Payer: Self-pay | Admitting: Obstetrics and Gynecology

## 2021-05-31 DIAGNOSIS — O074 Failed attempted termination of pregnancy without complication: Secondary | ICD-10-CM | POA: Diagnosis not present

## 2021-05-31 DIAGNOSIS — O0380 Unspecified complication following complete or unspecified spontaneous abortion: Secondary | ICD-10-CM

## 2021-05-31 DIAGNOSIS — Z332 Encounter for elective termination of pregnancy: Secondary | ICD-10-CM

## 2021-05-31 DIAGNOSIS — O0489 (Induced) termination of pregnancy with other complications: Secondary | ICD-10-CM | POA: Diagnosis not present

## 2021-05-31 DIAGNOSIS — N939 Abnormal uterine and vaginal bleeding, unspecified: Secondary | ICD-10-CM

## 2021-05-31 LAB — CBC
HCT: 28.2 % — ABNORMAL LOW (ref 36.0–46.0)
Hemoglobin: 10 g/dL — ABNORMAL LOW (ref 12.0–15.0)
MCH: 32.3 pg (ref 26.0–34.0)
MCHC: 35.5 g/dL (ref 30.0–36.0)
MCV: 91 fL (ref 80.0–100.0)
Platelets: 180 10*3/uL (ref 150–400)
RBC: 3.1 MIL/uL — ABNORMAL LOW (ref 3.87–5.11)
RDW: 12.2 % (ref 11.5–15.5)
WBC: 14.2 10*3/uL — ABNORMAL HIGH (ref 4.0–10.5)
nRBC: 0 % (ref 0.0–0.2)

## 2021-05-31 LAB — WET PREP, GENITAL
Clue Cells Wet Prep HPF POC: NONE SEEN
Sperm: NONE SEEN
Trich, Wet Prep: NONE SEEN
Yeast Wet Prep HPF POC: NONE SEEN

## 2021-05-31 LAB — ABO/RH: ABO/RH(D): A POS

## 2021-05-31 LAB — POCT PREGNANCY, URINE: Preg Test, Ur: POSITIVE — AB

## 2021-05-31 LAB — HCG, QUANTITATIVE, PREGNANCY: hCG, Beta Chain, Quant, S: 26771 m[IU]/mL — ABNORMAL HIGH (ref <5)

## 2021-05-31 MED ORDER — OXYCODONE-ACETAMINOPHEN 5-325 MG PO TABS: 1.0000 | ORAL_TABLET | Freq: Four times a day (QID) | ORAL | 0 refills | Status: DC | PRN

## 2021-05-31 MED ORDER — MISOPROSTOL 200 MCG PO TABS: ORAL_TABLET | ORAL | 1 refills | Status: DC

## 2021-05-31 NOTE — MAU Note (Signed)
Pt reports + pregnancy test at home on May 10th. Pt reports on May 24th [facility] [place] and was given misoprostol. Pt reports she was 6wks and two days by ultrasound that day. Pt reports passing one clot that night.   Pt reports she was still feeling nauseas and went back June 10 and had a procedure to remove the sac.   Pt reports on June 20th she had to leave work due to pain she was having.  Pt reports that last night she started bleeding heavily. She states she was soaking through 1 pad an hour.

## 2021-05-31 NOTE — MAU Provider Note (Addendum)
Chief Complaint:  Vaginal Bleeding   Event Date/Time   First Provider Initiated Contact with Patient 05/31/21 2133     HPI: Megan Owens is a 30 y.o. G1P1001 at Unknown who presents to maternity admissions reporting vaginal bleeding. Patient reports that she was at [facility] on May 24th and for an abortion. She was reportedly [redacted]w[redacted]d by their ultrasound and was given a dose of Misoprostol. Patient reports that she had a small amount of bleeding and passed what she thought was the baby. Patient reports that she continued to have nausea and was re-evaluated on June 10th where she says that she was told she had not passed the baby so she had a D&E performed. Patient reports that she did not have any bleeding after the procedure. She reports 2 days ago she started having left sided abdominal pain that she describes as "labor pain". She started having vaginal bleeding last night and today she reports having to change her maxi pad every hour for the past several hours. She reports passing several "fist sized" clots and that the blood just "pours out". She denies pain at this time, but reports feeling dizzy, lightheaded and has ringing in her ears at times.    Pregnancy Course:   Past Medical History:  Diagnosis Date   Acid reflux    Anemia    Anxiety    Bipolar 1 disorder (HCC)    Bipolar disorder (HCC)    Hernia, hiatal    HSV infection    last outbreak April 2020; taking Valtrex for past month according to patient   Macular degeneration    Ovarian cyst    Vaginal Pap smear, abnormal    OB History  Gravida Para Term Preterm AB Living  1 1 1  0 0 1  SAB IAB Ectopic Multiple Live Births  0 0 0 0 1    # Outcome Date GA Lbr Len/2nd Weight Sex Delivery Anes PTL Lv  1 Term 08/15/19 [redacted]w[redacted]d 04:30 / 01:03 3549 g F Vag-Spont EPI  LIV   Past Surgical History:  Procedure Laterality Date   TYMPANOSTOMY TUBE PLACEMENT     WISDOM TOOTH EXTRACTION     Family History  Problem Relation Age  of Onset   Diabetes Mother    Hypertension Father    Diabetes Maternal Grandmother    Diabetes Paternal Grandfather    Social History   Tobacco Use   Smoking status: Never   Smokeless tobacco: Never  Vaping Use   Vaping Use: Never used  Substance Use Topics   Alcohol use: No   Drug use: Not Currently    Types: Marijuana    Comment: until +upt   No Known Allergies Medications Prior to Admission  Medication Sig Dispense Refill Last Dose   valACYclovir (VALTREX) 500 MG tablet Take 500 mg by mouth 2 (two) times daily.   05/31/2021   ibuprofen (ADVIL) 600 MG tablet Take 1 tablet (600 mg total) by mouth 4 (four) times daily. 60 tablet 1    pantoprazole (PROTONIX) 40 MG tablet Take 40 mg by mouth daily.      Prenatal Vit-Fe Fumarate-FA (PRENATAL MULTIVITAMIN) TABS tablet Take 1 tablet by mouth daily at 12 noon.       I have reviewed patient's Past Medical Hx, Surgical Hx, Family Hx, Social Hx, medications and allergies.   ROS:  Review of Systems  Constitutional: Negative.   Respiratory: Negative.    Cardiovascular: Negative.   Gastrointestinal: Negative.   Genitourinary:  Positive for vaginal bleeding.  Musculoskeletal: Negative.   Neurological: Negative.   Psychiatric/Behavioral: Negative.     Physical Exam  Patient Vitals for the past 24 hrs:  BP Temp Pulse Resp SpO2  05/31/21 2125 (!) 108/58 99.1 F (37.3 C) (!) 114 20 98 %    Constitutional: well-developed, well-nourished female in no acute distress.  Cardiovascular: normal rate Respiratory: normal effort GI: abd soft, non-tender MS: extremities nontender, no edema, normal ROM Neurologic: alert and oriented x 4.  GU: neg CVAT. Pelvic: NEFG, small amount of dark red blooding coming through cervical os, cervix clean without lesions or masses, appears visually closed, no CMT     Labs: Results for orders placed or performed during the hospital encounter of 05/31/21 (from the past 24 hour(s))  ABO/Rh     Status:  None   Collection Time: 05/31/21  9:54 PM  Result Value Ref Range   ABO/RH(D) A POS    No rh immune globuloin      NOT A RH IMMUNE GLOBULIN CANDIDATE, PT RH POSITIVE Performed at [facility], [address], [place], [place] [address]   Wet prep, genital     Status: Abnormal   Collection Time: 05/31/21 10:04 PM  Result Value Ref Range   Yeast Wet Prep HPF POC NONE SEEN NONE SEEN   Trich, Wet Prep NONE SEEN NONE SEEN   Clue Cells Wet Prep HPF POC NONE SEEN NONE SEEN   WBC, Wet Prep HPF POC MANY (A) NONE SEEN   Sperm NONE SEEN     Imaging:  No results found.  MAU Course: Orders Placed This Encounter  Procedures   Wet prep, genital   US OB LESS THAN 14 WEEKS WITH OB TRANSVAGINAL   CBC   hCG, quantitative, pregnancy   ABO/Rh   No orders of the defined types were placed in this encounter.   MDM: CBC, HCG Blood type A pos, Rhogam not indicated Wet prep and GC/CT collected [place] ordered Orthostatic BP's done Report handed over to Megan Owens, CNM    Megan Eng, MSN, CNM 05/31/2021 10:06 PM  Results for orders placed or performed during the hospital encounter of 05/31/21 (from the past 24 hour(s))  CBC     Status: Abnormal   Collection Time: 05/31/21  9:54 PM  Result Value Ref Range   WBC 14.2 (H) 4.0 - 10.5 K/uL   RBC 3.10 (L) 3.87 - 5.11 MIL/uL   Hemoglobin 10.0 (L) 12.0 - 15.0 g/dL   HCT 38.4 (L) 66.5 - 99.3 %   MCV 91.0 80.0 - 100.0 fL   MCH 32.3 26.0 - 34.0 pg   MCHC 35.5 30.0 - 36.0 g/dL   RDW 57.0 17.7 - 93.9 %   Platelets 180 150 - 400 K/uL   nRBC 0.0 0.0 - 0.2 %  hCG, quantitative, pregnancy     Status: Abnormal   Collection Time: 05/31/21  9:54 PM  Result Value Ref Range   hCG, Beta Chain, Quant, S 26,771 (H) <5 mIU/mL  ABO/Rh     Status: None   Collection Time: 05/31/21  9:54 PM  Result Value Ref Range   ABO/RH(D) A POS    No rh immune globuloin      NOT A RH IMMUNE GLOBULIN CANDIDATE, PT RH POSITIVE Performed at [facility], [address] [place], [place] [address]   Pregnancy, urine POC     Status: Abnormal   Collection Time: 05/31/21 10:01 PM  Result  Value Ref Range   Preg Test, Ur POSITIVE (A) NEGATIVE  Wet prep, genital     Status: Abnormal   Collection Time: 05/31/21 10:04 PM  Result Value Ref Range   Yeast Wet Prep HPF POC NONE SEEN NONE SEEN   Trich, Wet Prep NONE SEEN NONE SEEN   Clue Cells Wet Prep HPF POC NONE SEEN NONE SEEN   WBC, Wet Prep HPF POC MANY (A) NONE SEEN   Sperm NONE SEEN    US OB LESS THAN 14 WEEKS WITH OB TRANSVAGINAL  Result Date: 05/31/2021 CLINICAL DATA:  30 year old female with recent abortion presenting with vaginal bleeding. EXAM: OBSTETRIC <14 WK [place] AND TRANSVAGINAL OB US TECHNIQUE: Both transabdominal and transvaginal ultrasound examinations were performed for complete evaluation of the gestation as well as the maternal uterus, adnexal regions, and pelvic cul-de-sac. Transvaginal technique was performed to assess early pregnancy. COMPARISON:  None. FINDINGS: The uterus is retroverted appears unremarkable. The endometrium is thickened and slightly heterogeneous and measures 2 cm in thickness. No intrauterine pregnancy identified, in keeping with recent miscarriage. There is increased vascularity within the endometrium suspicious for retained product of conception. The ovaries are unremarkable. No significant free fluid in the pelvis. IMPRESSION: Status post recent abortion with findings concerning for retained product of conception. Correlation with clinical exam and HCG levels recommended. Electronically Signed   By: Elgie Collard M.D.   On: 05/31/2021 22:44    Discussed with Dr Shawnie Pons who recommends cytotec Reviewed with patient  A:  Status post Elective Abortion       Retained products of conception  P:   Discharge home       Rx Cytotec for home use       Rx #2 tablets Percocet for pain        Bleeding precautions     .Encouraged to return if she develops  worsening of symptoms, increase in pain, fever, or other concerning symptoms.   Megan Owens, CNM

## 2021-06-01 LAB — GC/CHLAMYDIA PROBE AMP (~~LOC~~) NOT AT ARMC
Chlamydia: NEGATIVE
Comment: NEGATIVE
Comment: NORMAL
Neisseria Gonorrhea: NEGATIVE

## 2022-08-30 ENCOUNTER — Emergency Department (HOSPITAL_COMMUNITY)
Admission: EM | Admit: 2022-08-30 | Discharge: 2022-08-30 | Discharge status: Against Medical Advice | Payer: Medicaid Other

## 2022-08-30 ENCOUNTER — Other Ambulatory Visit: Payer: Self-pay

## 2022-08-30 NOTE — ED Notes (Signed)
PATIENT LEFT AMA BEFORE TRIAGE

## 2023-04-25 ENCOUNTER — Other Ambulatory Visit: Payer: Self-pay

## 2023-04-25 ENCOUNTER — Inpatient Hospital Stay (HOSPITAL_COMMUNITY)
Admission: AD | Admit: 2023-04-25 | Discharge: 2023-04-25 | Disposition: A | Discharge status: Home / Self Care | Payer: BC Managed Care – PPO | Attending: Obstetrics and Gynecology | Admitting: Obstetrics and Gynecology

## 2023-04-25 ENCOUNTER — Inpatient Hospital Stay (HOSPITAL_COMMUNITY): Payer: BC Managed Care – PPO

## 2023-04-25 ENCOUNTER — Encounter (HOSPITAL_COMMUNITY): Payer: Self-pay | Admitting: Obstetrics and Gynecology

## 2023-04-25 DIAGNOSIS — O021 Missed abortion: Secondary | ICD-10-CM | POA: Insufficient documentation

## 2023-04-25 DIAGNOSIS — Z3A08 8 weeks gestation of pregnancy: Secondary | ICD-10-CM | POA: Insufficient documentation

## 2023-04-25 DIAGNOSIS — O26851 Spotting complicating pregnancy, first trimester: Secondary | ICD-10-CM | POA: Diagnosis present

## 2023-04-25 LAB — CBC
HCT: 39.6 % (ref 36.0–46.0)
Hemoglobin: 13.7 g/dL (ref 12.0–15.0)
MCH: 31.9 pg (ref 26.0–34.0)
MCHC: 34.6 g/dL (ref 30.0–36.0)
MCV: 92.1 fL (ref 80.0–100.0)
Platelets: 160 10*3/uL (ref 150–400)
RBC: 4.3 MIL/uL (ref 3.87–5.11)
RDW: 12.5 % (ref 11.5–15.5)
WBC: 9.2 10*3/uL (ref 4.0–10.5)
nRBC: 0 % (ref 0.0–0.2)

## 2023-04-25 LAB — HCG, QUANTITATIVE, PREGNANCY: hCG, Beta Chain, Quant, S: 113958 m[IU]/mL — ABNORMAL HIGH (ref <5)

## 2023-04-25 MED ORDER — PROMETHAZINE HCL 25 MG PO TABS: 25.0000 mg | ORAL_TABLET | Freq: Four times a day (QID) | ORAL | 0 refills | Status: DC | PRN

## 2023-04-25 MED ORDER — OXYCODONE-ACETAMINOPHEN 5-325 MG PO TABS: 1.0000 | ORAL_TABLET | ORAL | 0 refills | Status: DC | PRN

## 2023-04-25 MED ORDER — IBUPROFEN 800 MG PO TABS: 800.0000 mg | ORAL_TABLET | ORAL | 1 refills | Status: DC | PRN

## 2023-04-25 MED ORDER — MISOPROSTOL 200 MCG PO TABS: ORAL_TABLET | ORAL | 0 refills | Status: DC

## 2023-04-25 NOTE — MAU Provider Note (Signed)
History     CSN: [id number]  Arrival date and time: 04/25/23 1047   Event Date/Time   First Provider Initiated Contact with Patient 04/25/23 1213      Chief Complaint  Patient presents with   Cramping   Spotting   HPI  Megan Owens is a 32 y.o. G2P1001 at [redacted]w[redacted]d who presents for evaluation of cramping and spotting. Patient reports she had a positive HPT and had her pregnancy confirmed with blood work at [facility] on 5/8. Her HCG at that time was 40981 and she was told this was appropriate for dates. [facility] and saw no cardiac activity. She reports she called [facility] who told her that they could not manage this for her and suggested she come to MAU.   She reports she is having lower abdominal cramping. Patient rates the pain as a 3/10 and has not tried anything for the pain. She also reports some spotting when she wipes. She denies any discharge. Denies any constipation, diarrhea or any urinary complaints.   OB History     Gravida  2   Para  1   Term  1   Preterm  0   AB  0   Living  1      SAB  0   IAB  0   Ectopic  0   Multiple  0   Live Births  1           Past Medical History:  Diagnosis Date   Acid reflux    Anemia    Anxiety    Bipolar 1 disorder (HCC)    Bipolar disorder (HCC)    Hernia, hiatal    HSV infection    last outbreak April 2020; taking Valtrex for past month according to patient   Macular degeneration    Ovarian cyst    Vaginal Pap smear, abnormal     Past Surgical History:  Procedure Laterality Date   TYMPANOSTOMY TUBE PLACEMENT     WISDOM TOOTH EXTRACTION      Family History  Problem Relation Age of Onset   Diabetes Mother    Hypertension Father    Diabetes Maternal Grandmother    Diabetes Paternal Grandfather     Social History   Tobacco Use   Smoking status: Never   Smokeless tobacco: Never  Vaping Use   Vaping Use: Never used   Substance Use Topics   Alcohol use: No   Drug use: Not Currently    Types: Marijuana    Comment: until +upt    Allergies: No Known Allergies  No medications prior to admission.    Review of Systems  Constitutional: Negative.  Negative for fatigue and fever.  HENT: Negative.    Respiratory: Negative.  Negative for shortness of breath.   Cardiovascular: Negative.  Negative for chest pain.  Gastrointestinal:  Positive for abdominal pain. Negative for constipation, diarrhea, nausea and vomiting.  Genitourinary:  Positive for vaginal bleeding. Negative for dysuria and vaginal discharge.  Neurological: Negative.  Negative for dizziness and headaches.   Physical Exam   Blood pressure (!) 123/50, pulse 83, temperature 98.2 F (36.8 C), temperature source Oral, resp. rate 19, height 5\' 5"  (1.651 m), weight 62.1 kg, last menstrual period 02/28/2023, SpO2 100 %, unknown if currently breastfeeding.  Patient Vitals for the past 24 hrs:  BP Temp Temp src Pulse Resp SpO2 Height Weight  04/25/23 1106 (!)  123/50 98.2 F (36.8 C) Oral 83 19 100 % -- --  04/25/23 1100 -- -- -- -- -- -- 5\' 5"  (1.651 m) 62.1 kg    Physical Exam Vitals and nursing note reviewed.  Constitutional:      General: She is not in acute distress.    Appearance: She is well-developed.  HENT:     Head: Normocephalic.  Eyes:     Pupils: Pupils are equal, round, and reactive to light.  Cardiovascular:     Rate and Rhythm: Normal rate and regular rhythm.     Heart sounds: Normal heart sounds.  Pulmonary:     Effort: Pulmonary effort is normal. No respiratory distress.     Breath sounds: Normal breath sounds.  Abdominal:     General: Bowel sounds are normal. There is no distension.     Palpations: Abdomen is soft.     Tenderness: There is no abdominal tenderness.  Skin:    General: Skin is warm and dry.  Neurological:     Mental Status: She is alert and oriented to person, place, and time.  Psychiatric:         Mood and Affect: Mood normal.        Behavior: Behavior normal.        Thought Content: Thought content normal.        Judgment: Judgment normal.    MAU Course  Procedures  Results for orders placed or performed during the hospital encounter of 04/25/23 (from the past 24 hour(s))  CBC     Status: None   Collection Time: 04/25/23 11:20 AM  Result Value Ref Range   WBC 9.2 4.0 - 10.5 K/uL   RBC 4.30 3.87 - 5.11 MIL/uL   Hemoglobin 13.7 12.0 - 15.0 g/dL   HCT 16.1 09.6 - 04.5 %   MCV 92.1 80.0 - 100.0 fL   MCH 31.9 26.0 - 34.0 pg   MCHC 34.6 30.0 - 36.0 g/dL   RDW 40.9 81.1 - 91.4 %   Platelets 160 150 - 400 K/uL   nRBC 0.0 0.0 - 0.2 %     US OB LESS THAN 14 WEEKS WITH OB TRANSVAGINAL  Result Date: 04/25/2023 CLINICAL DATA:  Vaginal bleeding EXAM: OBSTETRIC <14 WK [place] AND TRANSVAGINAL OB US TECHNIQUE: Both transabdominal and transvaginal ultrasound examinations were performed for complete evaluation of the gestation as well as the maternal uterus, adnexal regions, and pelvic cul-de-sac. Transvaginal technique was performed to assess early pregnancy. COMPARISON:  None Available. FINDINGS: Intrauterine gestational sac: Single Yolk sac:  Seen Embryo:  Seen Cardiac Activity: Not seen CRL:  12.8 mm   7 w   4 d                  [place] EDC: 12/08/2023 Subchorionic hemorrhage:  Small Maternal uterus/adnexae: Ovaries are unremarkable. The uterus is retroverted. There is linear hypoechoic area in the cervix, possibly blood products. There is no free fluid in pelvis. IMPRESSION: There is a gestational sac containing yolk sac and fetal pole within the uterus. There is no demonstrable fetal cardiac activity suggesting fetal demise. By crown-rump length measurement, estimated gestational age is 7 weeks 4 days. Small subchorionic bleed is noted adjacent to the gestational sac. There is hypoechogenic area in the region of cervical canal, possibly blood products. Electronically Signed   By: Ernie Avena  M.D.   On: 04/25/2023 12:07     MDM Labs ordered and reviewed.   CBC HCG A Pos US  OB Comp Less with Transvaginal  CNM independently reviewed the imaging ordered. Imaging show IUP measuring 7 weeks 4 days with no cardiac activity  CNM informed patient of results of ultrasound showing missed AB. Condolences provided and space given for patient to process emotions. CNM discussed options for management including expectant management, cytotec and surgical management. Risks and benefits of each reviewed at length. Patient desires medical management and verbalized understanding of risks and benefits of this method.   Assessment and Plan   1. Missed abortion with fetal demise before 20 completed weeks of gestation   2. [redacted] weeks gestation of pregnancy     -Discharge home in stable condition -Rx for cytotec, ibuprofen, pain medication and antiemetics sent to pharmacy -Vaginal bleeding and pain precautions discussed -Patient advised to follow-up with [facility] in 1 week for repeat labs and 2 weeks with a provider, message sent -Patient may return to MAU as needed or if her condition were to change or worsen  Rolm Bookbinder, CNM 04/25/2023, 12:13 PM

## 2023-04-25 NOTE — MAU Note (Addendum)
Megan Owens is a 33 y.o. at Unknown here in MAU reporting: [facility], was told no FHR seen.  States has been spotting and having cramps since last Monday.   States had +HPT and pregnancy confirmed with blood work @ [facility].  Reports told  blood work results were appropriate for gestational age. RN shown HCG level from [facility] 04/17/2023 on pt's phone.  HCG [id number]. LMP: 02/28/2023 Onset of complaint: Monday Pain score: 3 Vitals:   04/25/23 1106  BP: (!) 123/50  Pulse: 83  Resp: 19  Temp: 98.2 F (36.8 C)  SpO2: 100%     FHT:NA Lab orders placed from triage:   None

## 2023-05-03 ENCOUNTER — Other Ambulatory Visit: Payer: Self-pay | Admitting: General Practice

## 2023-05-03 ENCOUNTER — Other Ambulatory Visit: Payer: BC Managed Care – PPO

## 2023-05-03 DIAGNOSIS — O039 Complete or unspecified spontaneous abortion without complication: Secondary | ICD-10-CM

## 2023-05-04 LAB — BETA HCG QUANT (REF LAB): hCG Quant: 47484 m[IU]/mL

## 2023-05-13 ENCOUNTER — Ambulatory Visit (INDEPENDENT_AMBULATORY_CARE_PROVIDER_SITE_OTHER): Payer: BC Managed Care – PPO | Admitting: Obstetrics and Gynecology

## 2023-05-13 ENCOUNTER — Encounter: Payer: Self-pay | Admitting: Obstetrics and Gynecology

## 2023-05-13 ENCOUNTER — Other Ambulatory Visit: Payer: Self-pay

## 2023-05-13 VITALS — BP 104/67 | HR 66 | Ht 65.0 in | Wt 135.9 lb

## 2023-05-13 DIAGNOSIS — B009 Herpesviral infection, unspecified: Secondary | ICD-10-CM

## 2023-05-13 DIAGNOSIS — O039 Complete or unspecified spontaneous abortion without complication: Secondary | ICD-10-CM

## 2023-05-13 DIAGNOSIS — Z3A08 8 weeks gestation of pregnancy: Secondary | ICD-10-CM | POA: Diagnosis not present

## 2023-05-13 DIAGNOSIS — O021 Missed abortion: Secondary | ICD-10-CM | POA: Diagnosis not present

## 2023-05-13 MED ORDER — MISOPROSTOL 200 MCG PO TABS: ORAL_TABLET | ORAL | 1 refills | Status: DC

## 2023-05-13 MED ORDER — VALACYCLOVIR HCL 500 MG PO TABS: 500.0000 mg | ORAL_TABLET | Freq: Two times a day (BID) | ORAL | 3 refills | Status: DC

## 2023-05-13 NOTE — Progress Notes (Signed)
GYNECOLOGY OFFICE FOLLOW UP NOTE  History:  32 y.o. G2P1001 here today for follow up for missed abortion follow up. After taking cytotec in MAU, she passed 3 large blood clots, then bleeding slowed significantly. She is still having tender breasts, morning sickness and significant cramping. She does not feel like she has passed the pregnancy.  Also reporting HSV outbreak and requesting Rx.  Past Medical History:  Diagnosis Date   Acid reflux    Anemia    Anxiety    Bipolar 1 disorder (HCC)    Bipolar disorder (HCC)    Hernia, hiatal    HSV infection    last outbreak April 2020; taking Valtrex for past month according to patient   Macular degeneration    Ovarian cyst    Vaginal Pap smear, abnormal     Past Surgical History:  Procedure Laterality Date   TYMPANOSTOMY TUBE PLACEMENT     WISDOM TOOTH EXTRACTION       Current Outpatient Medications:    ibuprofen (ADVIL) 800 MG tablet, Take 1 tablet (800 mg total) by mouth every 4 (four) hours as needed., Disp: 30 tablet, Rfl: 1   Serdexmethylphen-Dexmethylphen (AZSTARYS) 26.1-5.2 MG CAPS, Take by mouth., Disp: , Rfl:    misoprostol (CYTOTEC) 200 MCG tablet, Place four tablets in between your gums and cheeks (two tablets on each side) as instructed, Disp: 4 tablet, Rfl: 1   oxyCODONE-acetaminophen (PERCOCET/ROXICET) 5-325 MG tablet, Take 1 tablet by mouth every 4 (four) hours as needed for moderate pain. (Patient not taking: Reported on 05/13/2023), Disp: 20 tablet, Rfl: 0   pantoprazole (PROTONIX) 40 MG tablet, Take 40 mg by mouth daily. (Patient not taking: Reported on 05/13/2023), Disp: , Rfl:    Prenatal Vit-Fe Fumarate-FA (PRENATAL MULTIVITAMIN) TABS tablet, Take 1 tablet by mouth daily at 12 noon. (Patient not taking: Reported on 05/13/2023), Disp: , Rfl:    promethazine (PHENERGAN) 25 MG tablet, Take 1 tablet (25 mg total) by mouth every 6 (six) hours as needed for nausea or vomiting. (Patient not taking: Reported on 05/13/2023),  Disp: 30 tablet, Rfl: 0   valACYclovir (VALTREX) 500 MG tablet, Take 1 tablet (500 mg total) by mouth 2 (two) times daily., Disp: 30 tablet, Rfl: 3  The following portions of the patient's history were reviewed and updated as appropriate: allergies, current medications, past family history, past medical history, past social history, past surgical history and problem list.   Review of Systems:  Pertinent items noted in HPI and remainder of comprehensive ROS otherwise negative.   Objective:  Physical Exam BP 104/67   Pulse 66   Ht 5\' 5"  (1.651 m)   Wt 61.6 kg   LMP 02/28/2023 Comment: SAB  Breastfeeding Unknown   BMI 22.61 kg/m  CONSTITUTIONAL: Well-developed, well-nourished female in no acute distress.  HENT:  Normocephalic, atraumatic. External right and left ear normal. Oropharynx is clear and moist EYES: Conjunctivae and EOM are normal. Pupils are equal, round, and reactive to light. No scleral icterus.  NECK: Normal range of motion, supple, no masses SKIN: Skin is warm and dry. No rash noted. Not diaphoretic. No erythema. No pallor. NEUROLOGIC: Alert and oriented to person, place, and time. Normal reflexes, muscle tone coordination. No cranial nerve deficit noted. PSYCHIATRIC: Normal mood and affect. Normal behavior. Normal judgment and thought content. CARDIOVASCULAR: Normal heart rate noted RESPIRATORY: Effort normal, no problems with respiration noted ABDOMEN: Soft, no distention noted.   PELVIC: deferrd MUSCULOSKELETAL: Normal range of motion. No edema noted.  Labs and Imaging US OB LESS THAN 14 WEEKS WITH OB TRANSVAGINAL  Result Date: 04/25/2023 CLINICAL DATA:  Vaginal bleeding EXAM: OBSTETRIC <14 WK [place] AND TRANSVAGINAL OB US TECHNIQUE: Both transabdominal and transvaginal ultrasound examinations were performed for complete evaluation of the gestation as well as the maternal uterus, adnexal regions, and pelvic cul-de-sac. Transvaginal technique was performed to assess  early pregnancy. COMPARISON:  None Available. FINDINGS: Intrauterine gestational sac: Single Yolk sac:  Seen Embryo:  Seen Cardiac Activity: Not seen CRL:  12.8 mm   7 w   4 d                  [place] EDC: 12/08/2023 Subchorionic hemorrhage:  Small Maternal uterus/adnexae: Ovaries are unremarkable. The uterus is retroverted. There is linear hypoechoic area in the cervix, possibly blood products. There is no free fluid in pelvis. IMPRESSION: There is a gestational sac containing yolk sac and fetal pole within the uterus. There is no demonstrable fetal cardiac activity suggesting fetal demise. By crown-rump length measurement, estimated gestational age is 7 weeks 4 days. Small subchorionic bleed is noted adjacent to the gestational sac. There is hypoechogenic area in the region of cervical canal, possibly blood products. Electronically Signed   By: Ernie Avena M.D.   On: 04/25/2023 12:07    Assessment & Plan:  1. Missed abortion - Based on symptoms, has not passed pregnancy tissue despite decrease in HCG - reviewed options for repeat misoprostol vs D&C, reviewed risks/benefits of both, she opts for repeat misoprostol, reviewed risks/precautions, she is comfortable with taking meds at home, Rx sent - H/H normal 2 weeks ago  2. HSV (herpes simplex virus) infection Rx for valtrex provided   Routine preventative health maintenance measures emphasized. Please refer to After Visit Summary for other counseling recommendations.   Return in about 2 weeks (around 05/27/2023) for with MD.   Baldemar Lenis, MD, [facility] Attending [facility] for [facility])

## 2023-05-13 NOTE — Progress Notes (Signed)
05/03/23 HCG: 47,484  Pt reports continued cramping, no bleeding w/ morning sickness.

## 2023-05-15 ENCOUNTER — Other Ambulatory Visit: Payer: Self-pay

## 2023-05-15 ENCOUNTER — Encounter (HOSPITAL_COMMUNITY): Payer: Self-pay

## 2023-05-15 ENCOUNTER — Emergency Department (HOSPITAL_COMMUNITY)
Admission: EM | Admit: 2023-05-15 | Discharge: 2023-05-15 | Discharge status: Against Medical Advice | Payer: BC Managed Care – PPO | Attending: Emergency Medicine | Admitting: Emergency Medicine

## 2023-05-15 DIAGNOSIS — R55 Syncope and collapse: Secondary | ICD-10-CM | POA: Insufficient documentation

## 2023-05-15 DIAGNOSIS — S0990XA Unspecified injury of head, initial encounter: Secondary | ICD-10-CM | POA: Insufficient documentation

## 2023-05-15 DIAGNOSIS — X58XXXA Exposure to other specified factors, initial encounter: Secondary | ICD-10-CM | POA: Insufficient documentation

## 2023-05-15 DIAGNOSIS — Z5321 Procedure and treatment not carried out due to patient leaving prior to being seen by health care provider: Secondary | ICD-10-CM | POA: Diagnosis not present

## 2023-05-15 LAB — BASIC METABOLIC PANEL
Anion gap: 9 (ref 5–15)
BUN: 9 mg/dL (ref 6–20)
CO2: 23 mmol/L (ref 22–32)
Calcium: 9.3 mg/dL (ref 8.9–10.3)
Chloride: 104 mmol/L (ref 98–111)
Creatinine, Ser: 0.68 mg/dL (ref 0.44–1.00)
GFR, Estimated: >60 mL/min (ref >60)
Glucose, Bld: 127 mg/dL — ABNORMAL HIGH (ref 70–99)
Potassium: 4.2 mmol/L (ref 3.5–5.1)
Sodium: 136 mmol/L (ref 135–145)

## 2023-05-15 LAB — CBC
HCT: 39.7 % (ref 36.0–46.0)
Hemoglobin: 13.6 g/dL (ref 12.0–15.0)
MCH: 31.8 pg (ref 26.0–34.0)
MCHC: 34.3 g/dL (ref 30.0–36.0)
MCV: 92.8 fL (ref 80.0–100.0)
Platelets: 155 10*3/uL (ref 150–400)
RBC: 4.28 MIL/uL (ref 3.87–5.11)
RDW: 12.4 % (ref 11.5–15.5)
WBC: 10.8 10*3/uL — ABNORMAL HIGH (ref 4.0–10.5)
nRBC: 0 % (ref 0.0–0.2)

## 2023-05-15 LAB — I-STAT BETA HCG BLOOD, ED (MC, WL, AP ONLY): I-stat hCG, quantitative: >2000 m[IU]/mL — ABNORMAL HIGH (ref <5)

## 2023-05-15 LAB — CBG MONITORING, ED: Glucose-Capillary: 125 mg/dL — ABNORMAL HIGH (ref 70–99)

## 2023-05-15 MED ORDER — ONDANSETRON 4 MG PO TBDP
4.0000 mg | ORAL_TABLET | Freq: Once | ORAL | Status: AC | PRN
  Administered 2023-05-15: 4 mg via ORAL
  Filled 2023-05-15: qty 1

## 2023-05-15 NOTE — ED Notes (Signed)
Pt c.o nausea, medication given, pt then asks about wait time, this RN informed pt of her wait, pt then stated she was leaving.

## 2023-05-15 NOTE — ED Triage Notes (Signed)
Pt reports syncopal episode around 2 pm today while sitting in the nail salon, staff at nail salon states pt hit her head on the desk and then hit her head on the floor. Pt states prior to syncopal episode she felt hot and dizzy. Pt had a miscarriage 2 weeks ago and had to take 2 doses of misoprostol, last dose was on Monday, Pt denies any significant vaginal bleeding today. Pt c.o pain to the left side of her head and jaw.

## 2023-05-16 ENCOUNTER — Telehealth: Payer: Self-pay | Admitting: Family Medicine

## 2023-05-16 NOTE — Telephone Encounter (Addendum)
I informed pt that Cytotec would not have caused her to become dizzy.  Pt reports that she had eaten and had something to drink.  Pt states that the same thing happened to her in the past when she had an abortion.  I explained to the pt that could be because of hormonal shift from pregnancy that could cause dizziness but make sure she monitors when if it happens again and let us know, contact her PCP, or go to Urgent Care. Pt verbalized understanding.   Leonette Nutting  05/16/23

## 2023-05-16 NOTE — Telephone Encounter (Signed)
Patient passed out and would like to know if its related to medication we prescribed her.

## 2023-05-28 ENCOUNTER — Other Ambulatory Visit: Payer: Self-pay

## 2023-05-28 ENCOUNTER — Ambulatory Visit (INDEPENDENT_AMBULATORY_CARE_PROVIDER_SITE_OTHER): Payer: BC Managed Care – PPO | Admitting: Obstetrics & Gynecology

## 2023-05-28 ENCOUNTER — Encounter: Payer: Self-pay | Admitting: Obstetrics & Gynecology

## 2023-05-28 VITALS — BP 97/52 | HR 74 | Wt 132.7 lb

## 2023-05-28 DIAGNOSIS — O039 Complete or unspecified spontaneous abortion without complication: Secondary | ICD-10-CM | POA: Diagnosis not present

## 2023-05-28 DIAGNOSIS — Z3A08 8 weeks gestation of pregnancy: Secondary | ICD-10-CM | POA: Diagnosis not present

## 2023-05-28 NOTE — Progress Notes (Signed)
Patient ID: Megan Owens, female   DOB: [birth year], 32 y.o.   MRN: [id number]   GYNECOLOGY OFFICE FOLLOW UP NOTE  History:  32 y.o. G2P1001 here today for follow up for after SAB that completed approximately 6/7 after 2 doses of cytotec. No pain now and notes light spotting.  She will f/u at [facility]  Past Medical History:  Diagnosis Date   Acid reflux    Anemia    Anxiety    Bipolar 1 disorder (HCC)    Bipolar disorder (HCC)    Hernia, hiatal    HSV infection    last outbreak April 2020; taking Valtrex for past month according to patient   Macular degeneration    Ovarian cyst    Vaginal Pap smear, abnormal     Past Surgical History:  Procedure Laterality Date   TYMPANOSTOMY TUBE PLACEMENT     WISDOM TOOTH EXTRACTION       Current Outpatient Medications:    promethazine (PHENERGAN) 25 MG tablet, Take 1 tablet (25 mg total) by mouth every 6 (six) hours as needed for nausea or vomiting., Disp: 30 tablet, Rfl: 0   Serdexmethylphen-Dexmethylphen (AZSTARYS) 26.1-5.2 MG CAPS, Take by mouth., Disp: , Rfl:    valACYclovir (VALTREX) 500 MG tablet, Take 1 tablet (500 mg total) by mouth 2 (two) times daily., Disp: 30 tablet, Rfl: 3   ibuprofen (ADVIL) 800 MG tablet, Take 1 tablet (800 mg total) by mouth every 4 (four) hours as needed. (Patient not taking: Reported on 05/28/2023), Disp: 30 tablet, Rfl: 1   misoprostol (CYTOTEC) 200 MCG tablet, Place four tablets in between your gums and cheeks (two tablets on each side) as instructed, Disp: 4 tablet, Rfl: 1   oxyCODONE-acetaminophen (PERCOCET/ROXICET) 5-325 MG tablet, Take 1 tablet by mouth every 4 (four) hours as needed for moderate pain. (Patient not taking: Reported on 05/13/2023), Disp: 20 tablet, Rfl: 0   pantoprazole (PROTONIX) 40 MG tablet, Take 40 mg by mouth daily. (Patient not taking: Reported on 05/13/2023), Disp: , Rfl:    Prenatal Vit-Fe Fumarate-FA (PRENATAL MULTIVITAMIN) TABS tablet, Take 1 tablet by mouth daily at 12 noon.  (Patient not taking: Reported on 05/13/2023), Disp: , Rfl:   The following portions of the patient's history were reviewed and updated as appropriate: allergies, current medications, past family history, past medical history, past social history, past surgical history and problem list.   Review of Systems:  Pertinent items noted in HPI and remainder of comprehensive ROS otherwise negative.   Objective:  Physical Exam BP (!) 97/52   Pulse 74   Wt 132 lb 11.2 oz (60.2 kg)   LMP 02/28/2023 Comment: SAB  BMI 22.08 kg/m  CONSTITUTIONAL: Well-developed, well-nourished female in no acute distress.  HENT:  Normocephalic, atraumatic. External right and left ear normal. Oropharynx is clear and moist EYES: Conjunctivae and EOM are normal. Pupils are equal, round, and reactive to light. No scleral icterus.  NECK: Normal range of motion, supple, no masses SKIN: Skin is warm and dry. No rash noted. Not diaphoretic. No erythema. No pallor. NEUROLOGIC: Alert and oriented to person, place, and time. Normal reflexes, muscle tone coordination. No cranial nerve deficit noted. PSYCHIATRIC: Normal mood and affect. Normal behavior. Normal judgment and thought content. CARDIOVASCULAR: Normal heart rate noted RESPIRATORY: Effort and breath sounds normal, no problems with respiration noted ABDOMEN:no distention noted.    MUSCULOSKELETAL: Normal range of motion. No edema noted.    Labs and Imaging No results found.  Assessment & Plan:  1. SAB (spontaneous abortion) F/u HCG - Beta hCG quant (ref lab)   Routine preventative health maintenance measures emphasized. Please refer to After Visit Summary for other counseling recommendations.  Plans to discuss [facility] with Dr. Reina Fuse Return if symptoms worsen or fail to improve.   Adam Phenix, MD

## 2023-05-28 NOTE — Progress Notes (Signed)
Patient informed me that she experienced syncope after taking Cytotec Rx. 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 Sx included headache, vision disturbance and "constantly feeling hot"  Pt was taken to ED but left before seen by a provider.  Ratterman stated that her pelvic cramps has stopped but still experience light bleeding  Behavior health resources was offered and she declined

## 2023-05-29 LAB — BETA HCG QUANT (REF LAB): hCG Quant: 37 m[IU]/mL

## 2023-12-11 NOTE — L&D Delivery Note (Signed)
 DELIVERY NOTE  Pt complete and at +2 station with urge to push. Epidural controlling pain. Pt pushed and delivered a viable female infant in direct OA position. Nuchal x1 noted, delivered through. Anterior and posterior shoulders spontaneously delivered with next two pushes; body easily followed next. Leg and body cord each x1. Infant placed on mothers abdomen and bulb suction of mouth and nose performed. Cord was then clamped and cut by friend of patient. Cord blood obtained, 3VC. Baby had a spontaneous cry noted but quiet. Taken to warmer with improved cry. Placenta then delivered at 1131 intact. Fundal massage performed and pitocin  per protocol. Fundus firm. The following lacerations were noted: perineal abrasion, hemostatic. Mother and baby stable. Counts correct. Wants to keep placenta  Infant time: 1126 Gender: female, desires circ Placenta time: 1131 Apgars: 7/9 Weight: 3870g/8lb9oz

## 2024-03-03 ENCOUNTER — Encounter (HOSPITAL_COMMUNITY): Payer: Self-pay | Admitting: Obstetrics and Gynecology

## 2024-03-03 NOTE — Progress Notes (Signed)
 Spoke w/ via phone for pre-op interview--- [facility] needs dos----   UPT per anesthesia. Surgeon orders requested 03/03/24.      Lab results------ COVID test -----patient states asymptomatic no test needed Arrive at -------0800 NPO after MN NO Solid Food.  Clear liquids from MN until---0700 Pre-Surgery Ensure or G2:  Med rec completed Medications to take morning of surgery -----NONE Diabetic medication -----  GLP1 agonist last dose: GLP1 instructions:  Patient instructed no nail polish to be worn day of surgery Patient instructed to bring photo id and insurance card day of surgery Patient aware to have Driver (ride ) / caregiver    for 24 hours after surgery - Friend Ned Card Patient Special Instructions ----- Shower with antibacterial soap. Instructed patient all piercing's would need to be remover per anesthesia guidelines, pt upset that her nose rings would have to come out even after explaining reason they need to be removed. Pt stated if after she spoke with anesthesia if they told her she would have to remove nose piercing's she would leave/cancel. Pre-Op special Instructions -----  Patient verbalized understanding of instructions that were given at this phone interview. Patient denies chest pain, sob, fever, cough at the interview.

## 2024-03-13 ENCOUNTER — Ambulatory Visit (HOSPITAL_COMMUNITY): Admitting: Anesthesiology

## 2024-03-13 ENCOUNTER — Ambulatory Visit (HOSPITAL_BASED_OUTPATIENT_CLINIC_OR_DEPARTMENT_OTHER): Admitting: Anesthesiology

## 2024-03-13 ENCOUNTER — Encounter (HOSPITAL_COMMUNITY)
Admission: RE | Disposition: A | Discharge status: Home / Self Care | Payer: Self-pay | Source: Home / Self Care | Attending: Obstetrics and Gynecology

## 2024-03-13 ENCOUNTER — Other Ambulatory Visit: Payer: Self-pay

## 2024-03-13 ENCOUNTER — Encounter (HOSPITAL_COMMUNITY): Payer: Self-pay | Admitting: Obstetrics and Gynecology

## 2024-03-13 ENCOUNTER — Ambulatory Visit (HOSPITAL_COMMUNITY)
Admission: RE | Admit: 2024-03-13 | Discharge: 2024-03-13 | Disposition: A | Discharge status: Home / Self Care | Payer: BC Managed Care – PPO | Attending: Obstetrics and Gynecology | Admitting: Obstetrics and Gynecology

## 2024-03-13 DIAGNOSIS — N939 Abnormal uterine and vaginal bleeding, unspecified: Secondary | ICD-10-CM | POA: Diagnosis present

## 2024-03-13 DIAGNOSIS — Z01818 Encounter for other preprocedural examination: Secondary | ICD-10-CM

## 2024-03-13 DIAGNOSIS — N840 Polyp of corpus uteri: Secondary | ICD-10-CM | POA: Diagnosis not present

## 2024-03-13 HISTORY — DX: Other complications of anesthesia, initial encounter: T88.59XA

## 2024-03-13 HISTORY — PX: DILATATION & CURETTAGE/HYSTEROSCOPY WITH MYOSURE: SHX6511

## 2024-03-13 LAB — POCT PREGNANCY, URINE: Preg Test, Ur: NEGATIVE

## 2024-03-13 SURGERY — DILATATION & CURETTAGE/HYSTEROSCOPY WITH MYOSURE
Anesthesia: General | Site: Vagina

## 2024-03-13 MED ORDER — MIDAZOLAM HCL 2 MG/2ML IJ SOLN
INTRAMUSCULAR | Status: AC
  Filled 2024-03-13: qty 2

## 2024-03-13 MED ORDER — POVIDONE-IODINE 10 % EX SWAB: 2.0000 | Freq: Once | CUTANEOUS | Status: DC

## 2024-03-13 MED ORDER — FENTANYL CITRATE (PF) 100 MCG/2ML IJ SOLN: 25.0000 ug | INTRAMUSCULAR | Status: DC | PRN

## 2024-03-13 MED ORDER — MIDAZOLAM HCL 2 MG/2ML IJ SOLN
INTRAMUSCULAR | Status: DC | PRN
  Administered 2024-03-13: 2 mg via INTRAVENOUS

## 2024-03-13 MED ORDER — KETOROLAC TROMETHAMINE 30 MG/ML IJ SOLN
INTRAMUSCULAR | Status: DC | PRN
  Administered 2024-03-13: 30 mg via INTRAVENOUS

## 2024-03-13 MED ORDER — LACTATED RINGERS IV SOLN: INTRAVENOUS | Status: DC

## 2024-03-13 MED ORDER — LIDOCAINE HCL 1 % IJ SOLN
INTRAMUSCULAR | Status: DC | PRN
  Administered 2024-03-13: 10 mL

## 2024-03-13 MED ORDER — SILVER NITRATE-POT NITRATE 75-25 % EX MISC
CUTANEOUS | Status: DC | PRN
Start: 2024-03-13 — End: 2024-03-13
  Administered 2024-03-13: 4

## 2024-03-13 MED ORDER — FENTANYL CITRATE (PF) 250 MCG/5ML IJ SOLN
INTRAMUSCULAR | Status: AC
  Filled 2024-03-13: qty 5

## 2024-03-13 MED ORDER — FENTANYL CITRATE (PF) 250 MCG/5ML IJ SOLN
INTRAMUSCULAR | Status: DC | PRN
  Administered 2024-03-13 (×2): 50 ug via INTRAVENOUS

## 2024-03-13 MED ORDER — DEXAMETHASONE SODIUM PHOSPHATE 10 MG/ML IJ SOLN
INTRAMUSCULAR | Status: DC | PRN
  Administered 2024-03-13: 10 mg via INTRAVENOUS

## 2024-03-13 MED ORDER — LIDOCAINE HCL (PF) 1 % IJ SOLN
INTRAMUSCULAR | Status: AC
  Filled 2024-03-13: qty 90

## 2024-03-13 MED ORDER — SODIUM CHLORIDE 0.9 % IR SOLN
Status: DC | PRN
  Administered 2024-03-13: 3000 mL

## 2024-03-13 MED ORDER — ONDANSETRON HCL 4 MG/2ML IJ SOLN: 4.0000 mg | Freq: Four times a day (QID) | INTRAMUSCULAR | Status: DC | PRN

## 2024-03-13 MED ORDER — PROPOFOL 10 MG/ML IV BOLUS
INTRAVENOUS | Status: DC | PRN
  Administered 2024-03-13: 50 mg via INTRAVENOUS
  Administered 2024-03-13: 200 mg via INTRAVENOUS

## 2024-03-13 MED ORDER — SILVER NITRATE-POT NITRATE 75-25 % EX MISC
CUTANEOUS | Status: AC
  Filled 2024-03-13: qty 10

## 2024-03-13 MED ORDER — LACTATED RINGERS IV SOLN
INTRAVENOUS | Status: DC
Start: 2024-03-13 — End: 2024-03-13

## 2024-03-13 MED ORDER — CHLORHEXIDINE GLUCONATE 0.12 % MT SOLN
15.0000 mL | Freq: Once | OROMUCOSAL | Status: AC
  Administered 2024-03-13: 15 mL via OROMUCOSAL
  Filled 2024-03-13: qty 15

## 2024-03-13 MED ORDER — OXYCODONE HCL 5 MG PO TABS: 5.0000 mg | ORAL_TABLET | Freq: Once | ORAL | Status: DC | PRN

## 2024-03-13 MED ORDER — OXYCODONE HCL 5 MG/5ML PO SOLN: 5.0000 mg | Freq: Once | ORAL | Status: DC | PRN

## 2024-03-13 MED ORDER — LIDOCAINE 2% (20 MG/ML) 5 ML SYRINGE
INTRAMUSCULAR | Status: DC | PRN
  Administered 2024-03-13: 60 mg via INTRAVENOUS

## 2024-03-13 MED ORDER — ONDANSETRON HCL 4 MG/2ML IJ SOLN
INTRAMUSCULAR | Status: DC | PRN
  Administered 2024-03-13: 4 mg via INTRAVENOUS

## 2024-03-13 MED ORDER — KETOROLAC TROMETHAMINE 30 MG/ML IJ SOLN
INTRAMUSCULAR | Status: AC
  Filled 2024-03-13: qty 3

## 2024-03-13 MED ORDER — ORAL CARE MOUTH RINSE: 15.0000 mL | Freq: Once | OROMUCOSAL | Status: AC

## 2024-03-13 MED ORDER — PROPOFOL 10 MG/ML IV BOLUS
INTRAVENOUS | Status: AC
  Filled 2024-03-13: qty 20

## 2024-03-13 MED ORDER — PROPOFOL 500 MG/50ML IV EMUL
INTRAVENOUS | Status: DC | PRN
  Administered 2024-03-13: 150 ug/kg/min via INTRAVENOUS

## 2024-03-13 MED ORDER — LIDOCAINE-EPINEPHRINE 1 %-1:100000 IJ SOLN
INTRAMUSCULAR | Status: AC
  Filled 2024-03-13: qty 1

## 2024-03-13 SURGICAL SUPPLY — 15 items
CANISTER SUCT 3000ML PPV (MISCELLANEOUS) ×1 IMPLANT
CATH ROBINSON RED A/P 16FR (CATHETERS) ×1 IMPLANT
DEVICE MYOSURE LITE (MISCELLANEOUS) IMPLANT
DEVICE MYOSURE REACH (MISCELLANEOUS) IMPLANT
GLOVE BIO SURGEON STRL SZ 6 (GLOVE) ×1 IMPLANT
GLOVE BIOGEL PI IND STRL 6.5 (GLOVE) ×1 IMPLANT
GLOVE SURG UNDER POLY LF SZ7 (GLOVE) ×1 IMPLANT
GOWN STRL REUS W/ TWL LRG LVL3 (GOWN DISPOSABLE) ×2 IMPLANT
KIT PROCEDURE FLUENT (KITS) ×1 IMPLANT
KIT TURNOVER KIT B (KITS) ×1 IMPLANT
PACK VAGINAL MINOR WOMEN LF (CUSTOM PROCEDURE TRAY) ×1 IMPLANT
PAD OB MATERNITY 11 LF (PERSONAL CARE ITEMS) ×1 IMPLANT
SEAL ROD LENS SCOPE MYOSURE (ABLATOR) ×1 IMPLANT
SOL .9 NS 3000ML IRR UROMATIC (IV SOLUTION) IMPLANT
TOWEL GREEN STERILE FF (TOWEL DISPOSABLE) ×2 IMPLANT

## 2024-03-13 NOTE — Anesthesia Procedure Notes (Signed)
 Procedure Name: LMA Insertion Date/Time: 03/13/2024 10:03 AM  Performed by: Jimmey Ralph, CRNAPre-anesthesia Checklist: Patient identified, Emergency Drugs available, Suction available and Patient being monitored Patient Re-evaluated:Patient Re-evaluated prior to induction Oxygen Delivery Method: Circle system utilized Preoxygenation: Pre-oxygenation with 100% oxygen Induction Type: IV induction Ventilation: Mask ventilation without difficulty LMA: LMA inserted LMA Size: 4.0 Tube type: Oral Number of attempts: 1 Placement Confirmation: breath sounds checked- equal and bilateral and positive ETCO2 Tube secured with: Tape Dental Injury: Teeth and Oropharynx as per pre-operative assessment

## 2024-03-13 NOTE — H&P (Signed)
 Megan Owens is an 33 y.o. female presenting for scheduled procedure  Pertinent Gynecological History: Menses:  intermenstrual despite use of continuous COC Bleeding: intermenstrual bleeding Contraception: OCP (estrogen/progesterone) DES exposure: denies Blood transfusions: none Sexually transmitted diseases: past history: HSV, GC/CC Previous GYN Procedures: DNC (in-office) Last pap: normal Date: 12/26/22 OB History: G3, P1021   Menstrual History: Menarche age: early teens Patient's last menstrual period was 02/21/2024 (approximate).    Past Medical History:  Diagnosis Date   Acid reflux    Anemia    Anxiety    Bipolar 1 disorder (HCC)    Bipolar disorder (HCC)    Hernia, hiatal    HSV infection    last outbreak April 2020; taking Valtrex for past month according to patient   Macular degeneration    Ovarian cyst    Vaginal Pap smear, abnormal     Past Surgical History:  Procedure Laterality Date   ESOPHAGOGASTRODUODENOSCOPY ENDOSCOPY     TYMPANOSTOMY TUBE PLACEMENT     WISDOM TOOTH EXTRACTION      Family History  Problem Relation Age of Onset   Diabetes Mother    Hypertension Father    Diabetes Maternal Grandmother    Diabetes Paternal Grandfather     Social History:  reports that she has never smoked. She has never used smokeless tobacco. She reports that she does not currently use drugs after having used the following drugs: Marijuana. She reports that she does not drink alcohol.  Allergies:  Allergies  Allergen Reactions   Egg-Derived Products     Like scrambled eggs or just egg cause stomach pains      Medications Prior to Admission  Medication Sig Dispense Refill Last Dose/Taking   Serdexmethylphen-Dexmethylphen (AZSTARYS) 26.1-5.2 MG CAPS Take 1 capsule by mouth daily.   Taking   valACYclovir (VALTREX) 500 MG tablet Take 1 tablet (500 mg total) by mouth 2 (two) times daily. (Patient taking differently: Take 500 mg by mouth 2 (two) times daily as  needed (breakouts).) 30 tablet 3 Taking Differently    Review of Systems  Constitutional:  Negative for chills and fever.  Respiratory:  Negative for shortness of breath.   Cardiovascular:  Negative for chest pain, palpitations and leg swelling.  Gastrointestinal:  Negative for abdominal pain, nausea and vomiting.  Neurological:  Negative for dizziness, weakness and headaches.  Psychiatric/Behavioral:  Negative for suicidal ideas.     Height 5\' 5"  (1.651 m), weight 65.8 kg, last menstrual period 02/21/2024, not currently breastfeeding. Physical Exam Gen: NAD CV: CTAB, RRR Abd: NTTP GU deferred Psych/Neuro WNL  No results found for this or any previous visit (from the past 24 hours).  No results found.  Assessment/Plan: This is a G3P1021 on continuous COC here for hysteroscopy D&C with myosure for Aub-IMB with polypoid structure on SIUS. The patient was informed of the risks and benefits of a hysteroscopy with dilation and curettage. Risks included but were not limited to bleeding, infections, injury to the vulva, vagina or cervix, or uterine perforation. If concern for latter, may proceed with diagnostic laparoscopy for evaluation of injury which may require surgical repair. Patient understands and is amenable.   Megan Owens 03/13/2024, 8:33 AM

## 2024-03-13 NOTE — Anesthesia Preprocedure Evaluation (Signed)
 Anesthesia Evaluation  Patient identified by MRN, date of birth, ID band Patient awake    Reviewed: Allergy & Precautions, H&P , NPO status , Patient's Chart, lab work & pertinent test results  Airway Mallampati: II   Neck ROM: full    Dental   Pulmonary Patient abstained from smoking.   breath sounds clear to auscultation       Cardiovascular negative cardio ROS  Rhythm:regular Rate:Normal     Neuro/Psych  PSYCHIATRIC DISORDERS Anxiety  Bipolar Disorder      GI/Hepatic ,GERD  ,,  Endo/Other    Renal/GU      Musculoskeletal   Abdominal   Peds  Hematology   Anesthesia Other Findings   Reproductive/Obstetrics                             Anesthesia Physical Anesthesia Plan  ASA: 2  Anesthesia Plan: General   Post-op Pain Management:    Induction: Intravenous  PONV Risk Score and Plan: 3 and Ondansetron, Dexamethasone, Midazolam and Treatment may vary due to age or medical condition  Airway Management Planned: LMA  Additional Equipment:   Intra-op Plan:   Post-operative Plan: Extubation in OR  Informed Consent: I have reviewed the patients History and Physical, chart, labs and discussed the procedure including the risks, benefits and alternatives for the proposed anesthesia with the patient or authorized representative who has indicated his/her understanding and acceptance.     Dental advisory given  Plan Discussed with: CRNA, Anesthesiologist and Surgeon  Anesthesia Plan Comments:        Anesthesia Quick Evaluation

## 2024-03-13 NOTE — Anesthesia Postprocedure Evaluation (Signed)
 Anesthesia Post Note  Patient: Megan Owens  Procedure(s) Performed: DILATATION & CURETTAGE/HYSTEROSCOPY WITH  MYOSURE (Vagina )     Patient location during evaluation: PACU Anesthesia Type: General Level of consciousness: awake Pain management: pain level controlled Vital Signs Assessment: post-procedure vital signs reviewed and stable Respiratory status: spontaneous breathing, nonlabored ventilation and respiratory function stable Cardiovascular status: blood pressure returned to baseline and stable Postop Assessment: no apparent nausea or vomiting Anesthetic complications: no   No notable events documented.  Last Vitals:  Vitals:   03/13/24 1130 03/13/24 1145  BP: 111/65 113/69  Pulse: 71 70  Resp: 16 17  Temp:  36.5 C  SpO2: 99% 99%    Last Pain:  Vitals:   03/13/24 1145  TempSrc:   PainSc: 0-No pain   Pain Goal: Patients Stated Pain Goal: 4 (03/13/24 1145)                 Imir Brumbach P Ziyan Schoon

## 2024-03-13 NOTE — Transfer of Care (Signed)
 Immediate Anesthesia Transfer of Care Note  Patient: Megan Owens  Procedure(s) Performed: DILATATION & CURETTAGE/HYSTEROSCOPY WITH  MYOSURE (Vagina )  Patient Location: PACU  Anesthesia Type:General  Level of Consciousness: awake and sedated  Airway & Oxygen Therapy: Patient Spontanous Breathing and Patient connected to face mask oxygen  Post-op Assessment: Report given to RN and Post -op Vital signs reviewed and stable  Post vital signs: Reviewed and stable  Last Vitals:  Vitals Value Taken Time  BP 92/43 03/13/24 1035  Temp    Pulse 54 03/13/24 1042  Resp 14 03/13/24 1042  SpO2 100 % 03/13/24 1042  Vitals shown include unfiled device data.  Last Pain:  Vitals:   03/13/24 0857  TempSrc: Oral  PainSc: 0-No pain      Patients Stated Pain Goal: 4 (03/13/24 0857)  Complications: No notable events documented.

## 2024-03-13 NOTE — Discharge Instructions (Signed)
     No ibuprofen, Advil, Aleve, Motrin, ketorolac, meloxicam, naproxen, or other NSAIDS until after 4:19 pm today if needed.     Post Anesthesia Home Care Instructions  Activity: Get plenty of rest for the remainder of the day. A responsible individual must stay with you for 24 hours following the procedure.  For the next 24 hours, DO NOT: -Drive a car -Advertising copywriter -Drink alcoholic beverages -Take any medication unless instructed by your physician -Make any legal decisions or sign important papers.  Meals: Start with liquid foods such as gelatin or soup. Progress to regular foods as tolerated. Avoid greasy, spicy, heavy foods. If nausea and/or vomiting occur, drink only clear liquids until the nausea and/or vomiting subsides. Call your physician if vomiting continues.  Special Instructions/Symptoms: Your throat may feel dry or sore from the anesthesia or the breathing tube placed in your throat during surgery. If this causes discomfort, gargle with warm salt water. The discomfort should disappear within 24 hours.

## 2024-03-13 NOTE — Op Note (Signed)
 03/13/24   Surgeon: Ellison Hughs, MD Preoperative Diagnoses: Abnormal uterine bleeding Postoperative Diagnoses: same as above   Procedures performed:  1) Myosure hysteroscopic resection of polypoid tissue   IVF: 800cc LR EBL: <10cc UOP: voided prior to procedure Fluid deficit: 100cc   Anesthesia: LMA  Findings: External genitalia WNL. BME reveals retroverted 8cm uterus, BL adnexa WNL.    Indications: Ms Parrillo is a 32yo [id number] with AUB-I. Preoperative imaging via SIUS shows possible polyp. Pt desires surgical management of polyp. Consented for above procedures   RBA:  The patient was informed of the risks and benefits of a hysteroscopy with dilation and curettage. Risks included but were not limited to bleeidng, infections, injury to the vulva, vagina or cerivx, or uterine perforation. If concern for latter, may proceed with diagnostic laparoscopy for evaluation of injury which may require surgical repair. Patient understands and is amenable.      Operative details: Patient was taken to operating room where general anesthesia via LMA was established. Placed in dorsal lithotomy with appropriate padded points. No antibiotics given preop via ACOG recommendations. Time out was performed after vaginal prep was carried out with Betadine. Tenaculum then placed on posterior cervical lip. Pratt dilators used (17) to dilate internal os. Gentle traction used to dilate internal os. Upon insertion of Myosure hysteroscope, findings noted as above. Myosure resection carried out until visible pahtology was removed. Scope withdrawn, tenaculum removed. Bleeding controlled with pressure. All instruments removed from vagina.   Fluid deficit controlled throughout, final deficit as above  Patient tolerated procedure well. All counts correct at end of procedure

## 2024-03-14 ENCOUNTER — Encounter (HOSPITAL_COMMUNITY): Payer: Self-pay | Admitting: Obstetrics and Gynecology

## 2024-03-16 LAB — SURGICAL PATHOLOGY

## 2024-05-06 LAB — HEPATITIS C ANTIBODY: HCV Ab: NEGATIVE

## 2024-05-06 LAB — OB RESULTS CONSOLE GC/CHLAMYDIA
Chlamydia: NEGATIVE
Neisseria Gonorrhea: NEGATIVE

## 2024-05-06 LAB — OB RESULTS CONSOLE RUBELLA ANTIBODY, IGM: Rubella: IMMUNE

## 2024-05-06 LAB — OB RESULTS CONSOLE RPR: RPR: NONREACTIVE

## 2024-05-06 LAB — OB RESULTS CONSOLE HIV ANTIBODY (ROUTINE TESTING): HIV: NONREACTIVE

## 2024-05-06 LAB — OB RESULTS CONSOLE ANTIBODY SCREEN: Antibody Screen: NEGATIVE

## 2024-05-06 LAB — OB RESULTS CONSOLE HEPATITIS B SURFACE ANTIGEN: Hepatitis B Surface Ag: NEGATIVE

## 2024-07-21 ENCOUNTER — Other Ambulatory Visit: Payer: Self-pay | Admitting: Obstetrics and Gynecology

## 2024-08-25 ENCOUNTER — Encounter (HOSPITAL_COMMUNITY): Payer: Self-pay | Admitting: Obstetrics and Gynecology

## 2024-08-25 ENCOUNTER — Inpatient Hospital Stay (HOSPITAL_COMMUNITY)
Admission: AD | Admit: 2024-08-25 | Discharge: 2024-08-25 | Disposition: A | Discharge status: Home / Self Care | Attending: Obstetrics & Gynecology | Admitting: Obstetrics & Gynecology

## 2024-08-25 DIAGNOSIS — B9689 Other specified bacterial agents as the cause of diseases classified elsewhere: Secondary | ICD-10-CM

## 2024-08-25 DIAGNOSIS — R1030 Lower abdominal pain, unspecified: Secondary | ICD-10-CM | POA: Diagnosis present

## 2024-08-25 DIAGNOSIS — O23592 Infection of other part of genital tract in pregnancy, second trimester: Secondary | ICD-10-CM | POA: Diagnosis not present

## 2024-08-25 DIAGNOSIS — Z3A26 26 weeks gestation of pregnancy: Secondary | ICD-10-CM | POA: Diagnosis not present

## 2024-08-25 DIAGNOSIS — N760 Acute vaginitis: Secondary | ICD-10-CM | POA: Diagnosis not present

## 2024-08-25 DIAGNOSIS — Z113 Encounter for screening for infections with a predominantly sexual mode of transmission: Secondary | ICD-10-CM | POA: Diagnosis present

## 2024-08-25 DIAGNOSIS — Z3689 Encounter for other specified antenatal screening: Secondary | ICD-10-CM | POA: Diagnosis not present

## 2024-08-25 LAB — URINALYSIS, ROUTINE W REFLEX MICROSCOPIC
Bilirubin Urine: NEGATIVE
Glucose, UA: NEGATIVE mg/dL
Hgb urine dipstick: NEGATIVE
Ketones, ur: 5 mg/dL — AB
Leukocytes,Ua: NEGATIVE
Nitrite: NEGATIVE
Protein, ur: NEGATIVE mg/dL
Specific Gravity, Urine: 1.01 (ref 1.005–1.030)
pH: 7 (ref 5.0–8.0)

## 2024-08-25 LAB — WET PREP, GENITAL
Sperm: NONE SEEN
Trich, Wet Prep: NONE SEEN
WBC, Wet Prep HPF POC: >=10 — AB (ref <10)
Yeast Wet Prep HPF POC: NONE SEEN

## 2024-08-25 MED ORDER — METRONIDAZOLE 500 MG PO TABS: 500.0000 mg | ORAL_TABLET | Freq: Two times a day (BID) | ORAL | 0 refills | Status: DC

## 2024-08-25 NOTE — Discharge Instructions (Signed)
Follow up with your OB.

## 2024-08-25 NOTE — MAU Provider Note (Signed)
 Chief Complaint:  Abdominal Pain   HPI      Megan Owens is a 33 y.o. G3P1011 at [redacted]w[redacted]d who presents to maternity admissions reporting she started feeling some lower abdominal cramping this afternoon while lying down.  Patient denies any leaking fluid, vaginal bleeding, urinary discomfort, vaginal discharge itching burning or irritation.  Patient states she has good fetal movements cannot and offers no other complaints at this time and is not requesting any pain management reports the cramping has resolved  Pregnancy Course: [facility]   Past Medical History:  Diagnosis Date   Acid reflux    Anemia    Anxiety    Bipolar 1 disorder (HCC)    Bipolar disorder (HCC)    Complication of anesthesia    Woke up during endoscopy and wisdom teeth   Hernia, hiatal    HSV infection    last outbreak April 2020; taking Valtrex  for past month according to patient   Macular degeneration    Ovarian cyst    Vaginal Pap smear, abnormal    OB History  Gravida Para Term Preterm AB Living  3 1 1  0 1 1  SAB IAB Ectopic Multiple Live Births  0 1 0 0 1    # Outcome Date GA Lbr Len/2nd Weight Sex Type Anes PTL Lv  3 Current           2 IAB 2024          1 Term 08/15/19 [redacted]w[redacted]d 04:30 / 01:03 3549 g F Vag-Spont EPI  LIV   Past Surgical History:  Procedure Laterality Date   DILATATION & CURETTAGE/HYSTEROSCOPY WITH MYOSURE N/A 03/13/2024   Procedure: DILATATION & CURETTAGE/HYSTEROSCOPY WITH  MYOSURE;  Surgeon: Sudie Lavonia HERO, MD;  Location: [facility] OR;  Service: Gynecology;  Laterality: N/A;   ESOPHAGOGASTRODUODENOSCOPY ENDOSCOPY     TYMPANOSTOMY TUBE PLACEMENT     WISDOM TOOTH EXTRACTION     Family History  Problem Relation Age of Onset   Diabetes Mother    Hypertension Father    Diabetes Maternal Grandmother    Diabetes Paternal Grandfather    Social History   Tobacco Use   Smoking status: Never   Smokeless tobacco: Never  Vaping Use   Vaping status: Never Used  Substance Use Topics    Alcohol use: No   Drug use: Not Currently    Types: Marijuana   Allergies  Allergen Reactions   Egg-Derived Products     Like scrambled eggs or just egg cause stomach pains     No medications prior to admission.    I have reviewed patient's Past Medical Hx, Surgical Hx, Family Hx, Social Hx, medications and allergies.   ROS  Pertinent items noted in HPI and remainder of comprehensive ROS otherwise negative.   PHYSICAL EXAM  Patient Vitals for the past 24 hrs:  BP Temp Temp src Pulse Resp SpO2 Height Weight  08/25/24 1721 (!) 112/53 -- -- 91 -- 100 % -- --  08/25/24 1621 (!) 110/58 98.4 F (36.9 C) Oral 95 17 100 % 5' 5 (1.651 m) 73.7 kg    Constitutional: Well-developed, well-nourished female in no acute distress.  Cardiovascular: normal rate & rhythm, warm and well-perfused Respiratory: normal effort, no problems with respiration noted GI: Abd soft, non-tender, gravid, no reproducible pain on palpation MS: Extremities nontender, no edema, normal ROM Neurologic: Alert and oriented x 4.  GU: no CVA tenderness Pelvic:Deferred   Fetal Tracing: Cat 1 for GA Baseline:135-140 Variability: moderate  Accelerations: present Decelerations:absent Toco: quite   Labs: Results for orders placed or performed during the hospital encounter of 08/25/24 (from the past 24 hours)  Wet prep, genital     Status: Abnormal   Collection Time: 08/25/24  4:51 PM   Specimen: Vaginal  Result Value Ref Range   Yeast Wet Prep HPF POC NONE SEEN NONE SEEN   Trich, Wet Prep NONE SEEN NONE SEEN   Clue Cells Wet Prep HPF POC PRESENT (A) NONE SEEN   WBC, Wet Prep HPF POC >=10 (A) <10   Sperm NONE SEEN   Urinalysis, Routine w reflex microscopic -Urine, Clean Catch     Status: Abnormal   Collection Time: 08/25/24  5:06 PM  Result Value Ref Range   Color, Urine YELLOW YELLOW   APPearance CLEAR CLEAR   Specific Gravity, Urine 1.010 1.005 - 1.030   pH 7.0 5.0 - 8.0   Glucose, UA NEGATIVE NEGATIVE  mg/dL   Hgb urine dipstick NEGATIVE NEGATIVE   Bilirubin Urine NEGATIVE NEGATIVE   Ketones, ur 5 (A) NEGATIVE mg/dL   Protein, ur NEGATIVE NEGATIVE mg/dL   Nitrite NEGATIVE NEGATIVE   Leukocytes,Ua NEGATIVE NEGATIVE    Imaging:  No results found.  MDM & MAU COURSE  MDM:  HIGH  Prenatal chart reviewed Physical exam performed U/A: Negative for UTI Vaginal swabs: C/W BV- RX to be sent at discharge NST for GA and fetal reassurance ( Cat 1 for GA)  Will plan for discharge with RX for BV and recommendations for a maternity support belt to provide pelvic support   MAU Course: Orders Placed This Encounter  Procedures   Wet prep, genital   Urinalysis, Routine w reflex microscopic -Urine, Clean Catch   Discharge patient Discharge disposition: 01-Home or Self Care; Discharge patient date: 08/25/2024   Discharge patient Discharge disposition: 01-Home or Self Care; Discharge patient date: 08/25/2024   Meds ordered this encounter  Medications   metroNIDAZOLE  (FLAGYL ) 500 MG tablet    Sig: Take 1 tablet (500 mg total) by mouth 2 (two) times daily.    Dispense:  14 tablet    Refill:  0    Supervising Provider:   PRATT, TANYA S [[id number]]      I have reviewed the patient chart and performed the physical exam . I have ordered & interpreted the lab results and reviewed and interpreted the NST Medications ordered as stated below.  A/P as described below.  Counseling and education provided and patient agreeable  with plan as described below. Verbalized understanding.    ASSESSMENT   1. Bacterial vaginosis   2. [redacted] weeks gestation of pregnancy   3. NST (non-stress test) reactive on fetal surveillance     PLAN  Discharge home in stable condition with return precautions.   See AVS for full description of information given to the patient including both verbal and written. Patient verbalized understanding and agrees with the plan as described above.      Allergies as of 08/25/2024        Reactions   Egg-derived Products    Like scrambled eggs or just egg cause stomach pains          Medication List     TAKE these medications    Azstarys 26.1-5.2 MG Caps Generic drug: Serdexmethylphen-Dexmethylphen Take 1 capsule by mouth daily.   metroNIDAZOLE  500 MG tablet Commonly known as: FLAGYL  Take 1 tablet (500 mg total) by mouth 2 (two) times daily.   multivitamin-prenatal 27-0.8 MG Tabs  tablet Take 1 tablet by mouth daily at 12 noon.   valACYclovir  500 MG tablet Commonly known as: VALTREX  Take 1 tablet (500 mg total) by mouth 2 (two) times daily. What changed:  when to take this reasons to take this        Olam Dalton, MSN, [facility], [facility]    This chart was dictated using voice recognition software, Dragon. Despite the best efforts of this provider to proofread and correct errors, errors may still occur which can change documentation meaning.

## 2024-08-25 NOTE — MAU Note (Signed)
 Megan Owens is a 33 y.o. at [redacted]w[redacted]d here in MAU reporting: was laying down.  Started feeling cramping in lower abd. No bleeding or LOF. Has noted an increase in vag d/c, is white, no irritation, itching or odor.   Baby has been active.  Onset of complaint: around 1500 Pain score: mild Vitals:   08/25/24 1621  BP: (!) 110/58  Pulse: 95  Resp: 17  Temp: 98.4 F (36.9 C)  SpO2: 100%     FHT:150 Lab orders placed from triage:  UA, vag swabs

## 2024-08-26 LAB — GC/CHLAMYDIA PROBE AMP (~~LOC~~) NOT AT ARMC
Chlamydia: NEGATIVE
Comment: NEGATIVE
Comment: NORMAL
Neisseria Gonorrhea: NEGATIVE

## 2024-09-03 NOTE — Progress Notes (Signed)
  Subjective Patient ID: Megan Owens is a 33 y.o. female.  Chief Complaint  Patient presents with  . Sore Throat    PT c/o sore throat and congestion. Symptoms started yesterday.     The following information was reviewed by members of the visit team:  Tobacco  Allergies  Meds  Med Hx  Surg Hx  Fam Hx  Soc Hx     33 year old female presents for evaluation of sore throat, congestion, rhinorrhea.  Symptoms began 1 day ago.  No associated fever or chills.  Mild bodyaches.  She denies diarrhea, nausea, vomiting.  No cough or shortness of breath.  No known sick contacts.    Review of Systems  Constitutional:  Negative for chills and fever.  HENT:  Positive for congestion, rhinorrhea and sore throat. Negative for ear pain, sinus pressure and sinus pain.   Respiratory:  Negative for cough and shortness of breath.   Cardiovascular:  Negative for chest pain.  Gastrointestinal:  Negative for diarrhea, nausea and vomiting.  Musculoskeletal:  Negative for arthralgias, myalgias, neck pain and neck stiffness.  Skin:  Negative for rash.  Neurological:  Negative for dizziness and headaches.  Hematological:  Negative for adenopathy. Does not bruise/bleed easily.    Objective Physical Exam Vitals reviewed.  Constitutional:      General: She is not in acute distress.    Appearance: Normal appearance. She is not ill-appearing, toxic-appearing or diaphoretic.  HENT:     Right Ear: Tympanic membrane, ear canal and external ear normal.     Left Ear: Tympanic membrane, ear canal and external ear normal.     Nose: Congestion present. No rhinorrhea.     Mouth/Throat:     Mouth: Mucous membranes are moist.     Pharynx: Posterior oropharyngeal erythema present. No oropharyngeal exudate.  Eyes:     Conjunctiva/sclera: Conjunctivae normal.     Pupils: Pupils are equal, round, and reactive to light.  Cardiovascular:     Rate and Rhythm: Normal rate and regular rhythm.     Pulses:  Normal pulses.  Pulmonary:     Effort: Pulmonary effort is normal. No respiratory distress.     Breath sounds: Normal breath sounds.  Musculoskeletal:     Cervical back: Normal range of motion and neck supple. No rigidity.  Skin:    General: Skin is warm and dry.     Capillary Refill: Capillary refill takes less than 2 seconds.     Findings: No rash.  Neurological:     Mental Status: She is alert and oriented to person, place, and time.  Psychiatric:        Mood and Affect: Mood normal.     Assessment/Plan 33 yo F with 1 day of sore throat and nasal congestion POC covid test is negative No sign of bacterial infection on exam. Symptoms and exam most consistent with viral URI Recommend OTC decongestants as needed for symptoms. Follow with PCP or return to clinic as needed for persistent symptoms.  Urgent Care Disposition:  Home Care    Electronically signed: Franky Floria Finder, PA-C 09/03/2024  11:50 AM

## 2024-09-17 ENCOUNTER — Encounter (HOSPITAL_COMMUNITY): Payer: Self-pay

## 2024-09-17 ENCOUNTER — Inpatient Hospital Stay (HOSPITAL_COMMUNITY)
Admission: AD | Admit: 2024-09-17 | Discharge: 2024-09-17 | Disposition: A | Discharge status: Home / Self Care | Attending: Obstetrics & Gynecology | Admitting: Obstetrics & Gynecology

## 2024-09-17 DIAGNOSIS — M545 Low back pain, unspecified: Secondary | ICD-10-CM | POA: Diagnosis present

## 2024-09-17 DIAGNOSIS — O26893 Other specified pregnancy related conditions, third trimester: Secondary | ICD-10-CM | POA: Diagnosis not present

## 2024-09-17 DIAGNOSIS — N949 Unspecified condition associated with female genital organs and menstrual cycle: Secondary | ICD-10-CM | POA: Insufficient documentation

## 2024-09-17 DIAGNOSIS — Z113 Encounter for screening for infections with a predominantly sexual mode of transmission: Secondary | ICD-10-CM | POA: Diagnosis present

## 2024-09-17 DIAGNOSIS — R519 Headache, unspecified: Secondary | ICD-10-CM | POA: Diagnosis not present

## 2024-09-17 DIAGNOSIS — R1030 Lower abdominal pain, unspecified: Secondary | ICD-10-CM | POA: Insufficient documentation

## 2024-09-17 DIAGNOSIS — R102 Pelvic and perineal pain unspecified side: Secondary | ICD-10-CM

## 2024-09-17 DIAGNOSIS — Z3A29 29 weeks gestation of pregnancy: Secondary | ICD-10-CM

## 2024-09-17 DIAGNOSIS — Z3689 Encounter for other specified antenatal screening: Secondary | ICD-10-CM | POA: Diagnosis not present

## 2024-09-17 LAB — URINALYSIS, ROUTINE W REFLEX MICROSCOPIC
Bilirubin Urine: NEGATIVE
Glucose, UA: NEGATIVE mg/dL
Hgb urine dipstick: NEGATIVE
Ketones, ur: NEGATIVE mg/dL
Leukocytes,Ua: NEGATIVE
Nitrite: NEGATIVE
Protein, ur: NEGATIVE mg/dL
Specific Gravity, Urine: 1.003 — ABNORMAL LOW (ref 1.005–1.030)
pH: 7 (ref 5.0–8.0)

## 2024-09-17 LAB — WET PREP, GENITAL
Clue Cells Wet Prep HPF POC: NONE SEEN
Sperm: NONE SEEN
Trich, Wet Prep: NONE SEEN
WBC, Wet Prep HPF POC: <10 (ref <10)
Yeast Wet Prep HPF POC: NONE SEEN

## 2024-09-17 MED ORDER — CYCLOBENZAPRINE HCL 10 MG PO TABS: 10.0000 mg | ORAL_TABLET | Freq: Two times a day (BID) | ORAL | 0 refills | Status: AC | PRN

## 2024-09-17 MED ORDER — ACETAMINOPHEN 500 MG PO TABS
1000.0000 mg | ORAL_TABLET | Freq: Once | ORAL | Status: AC
  Administered 2024-09-17: 1000 mg via ORAL
  Filled 2024-09-17: qty 2

## 2024-09-17 MED ORDER — CYCLOBENZAPRINE HCL 5 MG PO TABS
10.0000 mg | ORAL_TABLET | Freq: Once | ORAL | Status: AC
  Administered 2024-09-17: 10 mg via ORAL
  Filled 2024-09-17: qty 2

## 2024-09-17 NOTE — Discharge Instructions (Signed)
Follow up with your  OB provider

## 2024-09-17 NOTE — MAU Note (Signed)
 MAU Triage Note: Megan Owens is a 33 y.o. at [redacted]w[redacted]d here in MAU reporting: started having intermittent lower abdominal and back pain while at work about 1.5 hours ago. The pain is described as sharp, tightening, and pressure that is worse with movement. She also reports a headache. She has felt the urge to urinate frequently today, but when she goes the to bathroom there is not a lot that comes out. States that she feels like her underwear are damp, but denies noticing any leaking or pad use. Denies VB or abnormal discharge. Does report decreased FM that started about a week ago.   Patient complaint: Lower stomach pressure, Feeling tight, lower back pain, sharp pain  Pain Score: 3  Pain Location: Abdomen Pain Score: 3 Pain Location: Back   Onset of complaint: today LMP: Patient's last menstrual period was 02/21/2024 (approximate).  Vitals:   09/17/24 1926  BP: 123/72  Pulse: (!) 104  Resp: 20  Temp: 98.3 F (36.8 C)  SpO2: 98%    FHT:  Fetal Heart Rate Mode: External Baseline Rate (A): 135 bpm Lab orders placed from triage: UA

## 2024-09-17 NOTE — MAU Provider Note (Cosign Needed Addendum)
 History     CSN: [id number]  Arrival date and time: 09/17/24 1846     Chief Complaint  Patient presents with   Abdominal Pain   Back Pain   Headache   Megan Owens is a G33P1011 female at [redacted]w[redacted]d gestation presenting with complaint of pelvic pressure, lower back pain, headache, and decreased urination. She says her symptoms began while she was at work today. Both the pelvic pressure and lower back pain have been constant and unchanged in severity since onset. She says both are 3/10 on the pain scale. Patient describes the pelvic pressure as sharp and tightening, and the back pain as dull and aching. Her headache has resolved without medication. She explains that she has felt the urge to urinate frequently today, but is only urinating a small volume. Patient states that she feels like her underwear are damp, but denies noticing any leaking or pad use. She denies vaginal bleeding, vaginal discharge, nausea, dysuria, or hematuria.  Abdominal Pain Associated symptoms include headaches. Pertinent negatives include no dysuria, hematuria or nausea.  Back Pain Associated symptoms include abdominal pain and headaches. Pertinent negatives include no dysuria.  Headache  Associated symptoms include abdominal pain and back pain. Pertinent negatives include no nausea.    OB History     Gravida  3   Para  1   Term  1   Preterm  0   AB  1   Living  1      SAB  0   IAB  1   Ectopic  0   Multiple  0   Live Births  1           Past Medical History:  Diagnosis Date   Acid reflux    Anemia    Anxiety    Bipolar 1 disorder (HCC)    Bipolar disorder (HCC)    Complication of anesthesia    Woke up during endoscopy and wisdom teeth   Hernia, hiatal    HSV infection    last outbreak April 2020; taking Valtrex  for past month according to patient   Macular degeneration    Ovarian cyst    Vaginal Pap smear, abnormal     Past Surgical History:  Procedure Laterality Date    DILATATION & CURETTAGE/HYSTEROSCOPY WITH MYOSURE N/A 03/13/2024   Procedure: DILATATION & CURETTAGE/HYSTEROSCOPY WITH  MYOSURE;  Surgeon: Sudie Lavonia HERO, MD;  Location: [facility] OR;  Service: Gynecology;  Laterality: N/A;   ESOPHAGOGASTRODUODENOSCOPY ENDOSCOPY     TYMPANOSTOMY TUBE PLACEMENT     WISDOM TOOTH EXTRACTION      Family History  Problem Relation Age of Onset   Diabetes Mother    Hypertension Father    Diabetes Maternal Grandmother    Diabetes Paternal Grandfather     Social History   Tobacco Use   Smoking status: Never   Smokeless tobacco: Never  Vaping Use   Vaping status: Never Used  Substance Use Topics   Alcohol use: No   Drug use: Not Currently    Types: Marijuana    Allergies:  Allergies  Allergen Reactions   Egg Protein-Containing Drug Products     Like scrambled eggs or just egg cause stomach pains      Medications Prior to Admission  Medication Sig Dispense Refill Last Dose/Taking   Prenatal Vit-Fe Fumarate-FA (MULTIVITAMIN-PRENATAL) 27-0.8 MG TABS tablet Take 1 tablet by mouth daily at 12 noon.   09/17/2024   metroNIDAZOLE  (FLAGYL ) 500 MG tablet Take 1 tablet (500  mg total) by mouth 2 (two) times daily. (Patient not taking: Reported on 09/17/2024) 14 tablet 0 Not Taking   Serdexmethylphen-Dexmethylphen (AZSTARYS) 26.1-5.2 MG CAPS Take 1 capsule by mouth daily.      valACYclovir  (VALTREX ) 500 MG tablet Take 1 tablet (500 mg total) by mouth 2 (two) times daily. (Patient taking differently: Take 500 mg by mouth 2 (two) times daily as needed (breakouts).) 30 tablet 3     Review of Systems  Gastrointestinal:  Positive for abdominal pain. Negative for nausea.  Genitourinary:  Positive for decreased urine volume and urgency. Negative for dysuria, hematuria, vaginal bleeding and vaginal discharge.  Musculoskeletal:  Positive for back pain.  Neurological:  Positive for headaches.   Physical Exam   Blood pressure 119/67, pulse (!) 107, temperature 98.3 F  (36.8 C), temperature source Oral, resp. rate 20, height 5' 5 (1.651 m), weight 73.5 kg, last menstrual period 02/21/2024, SpO2 99%.  Physical Exam Constitutional:      General: She is not in acute distress.    Appearance: She is well-developed. She is not ill-appearing.  Cardiovascular:     Rate and Rhythm: Normal rate.  Pulmonary:     Effort: Pulmonary effort is normal.  Abdominal:     Palpations: Abdomen is soft.     Tenderness: There is no abdominal tenderness.  Genitourinary:    Comments: Pelvic deferred per patient ( Vaginal swabs obtained) Skin:    General: Skin is warm.  Neurological:     Mental Status: She is alert and oriented to person, place, and time.  Psychiatric:        Behavior: Behavior normal.   NST: Fetal baseline: 130bpm Variability: moderate Accelerations: present Decelerations: absent Toco: Quite   Results for orders placed or performed during the hospital encounter of 09/17/24 (from the past 24 hours)  Urinalysis, Routine w reflex microscopic -Urine, Clean Catch     Status: Abnormal   Collection Time: 09/17/24  7:31 PM  Result Value Ref Range   Color, Urine STRAW (A) YELLOW   APPearance CLEAR CLEAR   Specific Gravity, Urine 1.003 (L) 1.005 - 1.030   pH 7.0 5.0 - 8.0   Glucose, UA NEGATIVE NEGATIVE mg/dL   Hgb urine dipstick NEGATIVE NEGATIVE   Bilirubin Urine NEGATIVE NEGATIVE   Ketones, ur NEGATIVE NEGATIVE mg/dL   Protein, ur NEGATIVE NEGATIVE mg/dL   Nitrite NEGATIVE NEGATIVE   Leukocytes,Ua NEGATIVE NEGATIVE  Wet prep, genital     Status: None   Collection Time: 09/17/24  8:32 PM  Result Value Ref Range   Yeast Wet Prep HPF POC NONE SEEN NONE SEEN   Trich, Wet Prep NONE SEEN NONE SEEN   Clue Cells Wet Prep HPF POC NONE SEEN NONE SEEN   WBC, Wet Prep HPF POC <10 <10   Sperm NONE SEEN     MAU Course  Procedures  MDM  HIGH  Prenatal Chart reviewed Physical exam performed U/A: NM Wet Prep: Negative GC pending at  discharge Tylenol /Flexeril for discomfort with some resolve NST for GA and fetal reassurance ( Reactive NST)     Assessment and Plan   Pain of round ligament during pregnancy  [redacted] weeks gestation of pregnancy  NST (non-stress test) reactive on fetal surveillance     PLAN  Discharge from MAU in stable condition  Follow up with your [facility] provider  Maternity Support band recommended   See AVS for full description of educational information and instructions provided to the patient at time of discharge  Warning signs for worsening condition that would warrant emergency follow-up discussed Patient may return to MAU as needed    Burnard LITTIE Rummer 09/17/2024, 8:19 PM   Attestation of Supervision of Student:  I confirm that I have verified the information documented in the physician assistant student's note and that I have also personally reperformed the history, physical exam and all medical decision making activities.  I have verified that all services and findings are accurately documented in this student's note; and I agree with management and plan as outlined in the documentation. I have also made any necessary editorial changes.  I was present and confirmed the HPI, physical exam, interpreted the lab results and NST and agree with the A/P as written above.  Olam DELENA Dalton, NP [facility] [employer], [facility] 09/17/2024 9:32 PM

## 2024-09-18 LAB — GC/CHLAMYDIA PROBE AMP (~~LOC~~) NOT AT ARMC
Chlamydia: NEGATIVE
Comment: NEGATIVE
Comment: NORMAL
Neisseria Gonorrhea: NEGATIVE

## 2024-09-19 ENCOUNTER — Inpatient Hospital Stay (HOSPITAL_COMMUNITY)
Admission: AD | Admit: 2024-09-19 | Discharge: 2024-09-19 | Disposition: A | Discharge status: Home / Self Care | Attending: Obstetrics | Admitting: Obstetrics

## 2024-09-19 ENCOUNTER — Encounter (HOSPITAL_COMMUNITY): Payer: Self-pay | Admitting: Obstetrics

## 2024-09-19 ENCOUNTER — Other Ambulatory Visit: Payer: Self-pay

## 2024-09-19 DIAGNOSIS — Z3A30 30 weeks gestation of pregnancy: Secondary | ICD-10-CM

## 2024-09-19 DIAGNOSIS — Z3493 Encounter for supervision of normal pregnancy, unspecified, third trimester: Secondary | ICD-10-CM

## 2024-09-19 DIAGNOSIS — O368130 Decreased fetal movements, third trimester, not applicable or unspecified: Secondary | ICD-10-CM | POA: Diagnosis present

## 2024-09-19 NOTE — MAU Provider Note (Signed)
 History     CSN: [id number]  Arrival date and time: 09/19/24 1006   Event Date/Time   First Provider Initiated Contact with Patient 09/19/24 1026      Chief Complaint  Patient presents with   Decreased Fetal Movement   HPI  Megan Owens is a 33 y.o. G3P1011 at 109w1d who presents for evaluation of decreased fetal movement. Patient reports she woke up this morning and the baby did not move normally upon waking. She states she tried eating, drinking, moving and shaking her abdomen without improvement.  She denies any pain. She denies any vaginal bleeding, discharge, and leaking of fluid. Denies any constipation, diarrhea or any urinary complaints.   OB History     Gravida  3   Para  1   Term  1   Preterm  0   AB  1   Living  1      SAB  0   IAB  1   Ectopic  0   Multiple  0   Live Births  1           Past Medical History:  Diagnosis Date   Acid reflux    Anemia    Anxiety    Bipolar 1 disorder (HCC)    Bipolar disorder (HCC)    Complication of anesthesia    Woke up during endoscopy and wisdom teeth   Hernia, hiatal    HSV infection    last outbreak April 2020; taking Valtrex  for past month according to patient   Macular degeneration    Ovarian cyst    Vaginal Pap smear, abnormal     Past Surgical History:  Procedure Laterality Date   DILATATION & CURETTAGE/HYSTEROSCOPY WITH MYOSURE N/A 03/13/2024   Procedure: DILATATION & CURETTAGE/HYSTEROSCOPY WITH  MYOSURE;  Surgeon: Sudie Lavonia HERO, MD;  Location: [facility] OR;  Service: Gynecology;  Laterality: N/A;   ESOPHAGOGASTRODUODENOSCOPY ENDOSCOPY     TYMPANOSTOMY TUBE PLACEMENT     WISDOM TOOTH EXTRACTION      Family History  Problem Relation Age of Onset   Diabetes Mother    Hypertension Father    Diabetes Maternal Grandmother    Diabetes Paternal Grandfather     Social History   Tobacco Use   Smoking status: Never   Smokeless tobacco: Never  Vaping Use   Vaping status: Never Used   Substance Use Topics   Alcohol use: No   Drug use: Not Currently    Types: Marijuana    Allergies:  Allergies  Allergen Reactions   Egg Protein-Containing Drug Products     Like scrambled eggs or just egg cause stomach pains      No medications prior to admission.    Review of Systems  Constitutional: Negative.  Negative for fatigue and fever.  HENT: Negative.    Respiratory: Negative.  Negative for shortness of breath.   Cardiovascular: Negative.  Negative for chest pain.  Gastrointestinal: Negative.  Negative for abdominal pain, constipation, diarrhea, nausea and vomiting.  Genitourinary: Negative.  Negative for dysuria.  Neurological: Negative.  Negative for dizziness and headaches.   Physical Exam   Blood pressure 120/61, pulse (!) 103, temperature 98.4 F (36.9 C), temperature source Oral, resp. rate 16, last menstrual period 02/21/2024, SpO2 99%.  Patient Vitals for the past 24 hrs:  BP Temp Temp src Pulse Resp SpO2  09/19/24 1018 120/61 98.4 F (36.9 C) Oral (!) 103 16 99 %    Physical Exam Vitals and nursing note  reviewed.  Constitutional:      General: She is not in acute distress.    Appearance: She is well-developed.  HENT:     Head: Normocephalic.  Eyes:     Pupils: Pupils are equal, round, and reactive to light.  Cardiovascular:     Rate and Rhythm: Normal rate and regular rhythm.     Heart sounds: Normal heart sounds.  Pulmonary:     Effort: Pulmonary effort is normal. No respiratory distress.     Breath sounds: Normal breath sounds.  Abdominal:     General: Bowel sounds are normal. There is no distension.     Palpations: Abdomen is soft.     Tenderness: There is no abdominal tenderness.  Skin:    General: Skin is warm and dry.  Neurological:     Mental Status: She is alert and oriented to person, place, and time.  Psychiatric:        Mood and Affect: Mood normal.        Behavior: Behavior normal.        Thought Content: Thought content  normal.        Judgment: Judgment normal.     Fetal Tracing:  Baseline: 130 Variability: moderate Accels: 10x10 Decels: none  Toco: none      MAU Course  Procedures  MDM Labs ordered and reviewed.   NST reassuring for gestational age 30: one dip on FHR that appears to be deceleration, CNM at bedside talking with patient during this time and patient was demonstrating how she was shaking abdomen to move baby. Artifact on strip.  Reassurance provided.   Assessment and Plan   1. Movement of fetus present during pregnancy in third trimester   2. [redacted] weeks gestation of pregnancy     -Discharge home in stable condition -Fetal movement precautions discussed -Patient advised to follow-up with OB as scheduled for prenatal care -Patient may return to MAU as needed or if her condition were to change or worsen  Aleck CHRISTELLA Fireman, CNM 09/19/2024, 10:26 AM

## 2024-09-19 NOTE — Discharge Instructions (Signed)

## 2024-09-19 NOTE — MAU Note (Signed)
.  Megan Owens is a 33 y.o. at [redacted]w[redacted]d here in MAU reporting: Decreased fetal movement today. Pateint reports she woke up at 0800. Reports baby normally wakes up with her, but today he did not. She has tried drinking something cold and sweet, changing positions, and shaking her belly. She has got  minimal response from those interventions. Over all baby not moving normally.   Onset of complaint: today 0800 Pain score: denies  Vitals:   09/19/24 1018  BP: 120/61  Pulse: (!) 103  Resp: 16  Temp: 98.4 F (36.9 C)  SpO2: 99%     FHT:140 Lab orders placed from triage:  none

## 2024-11-03 LAB — OB RESULTS CONSOLE GBS: GBS: POSITIVE

## 2024-11-09 ENCOUNTER — Encounter (HOSPITAL_COMMUNITY): Payer: Self-pay | Admitting: Obstetrics and Gynecology

## 2024-11-09 ENCOUNTER — Inpatient Hospital Stay (HOSPITAL_COMMUNITY)
Admission: AD | Admit: 2024-11-09 | Discharge: 2024-11-09 | Disposition: A | Discharge status: Home / Self Care | Attending: Obstetrics and Gynecology | Admitting: Obstetrics and Gynecology

## 2024-11-09 DIAGNOSIS — O99613 Diseases of the digestive system complicating pregnancy, third trimester: Secondary | ICD-10-CM | POA: Insufficient documentation

## 2024-11-09 DIAGNOSIS — Z3A37 37 weeks gestation of pregnancy: Secondary | ICD-10-CM | POA: Insufficient documentation

## 2024-11-09 DIAGNOSIS — Z79899 Other long term (current) drug therapy: Secondary | ICD-10-CM | POA: Diagnosis not present

## 2024-11-09 DIAGNOSIS — K219 Gastro-esophageal reflux disease without esophagitis: Secondary | ICD-10-CM | POA: Diagnosis not present

## 2024-11-09 DIAGNOSIS — R2241 Localized swelling, mass and lump, right lower limb: Secondary | ICD-10-CM

## 2024-11-09 MED ORDER — PANTOPRAZOLE SODIUM 40 MG PO TBEC: 40.0000 mg | DELAYED_RELEASE_TABLET | Freq: Two times a day (BID) | ORAL | 1 refills | Status: AC
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
## 2024-11-09 NOTE — MAU Note (Signed)
 Megan Owens is a 33 y.o. at [redacted]w[redacted]d here in MAU reporting: On Friday she had swelling in her  left foot. She messaged her Doctor on Friday and they have reponded today.  Swelling is better now with rest over the weekend. Pt is concerned because she has had a blood clot before. Reports some acheness in her left calf   LMP:  Onset of complaint: Friday  Pain score: 3 Vitals:   11/09/24 1625  BP: 114/69  Pulse: (!) 121  Resp: 18  Temp: 98.5 F (36.9 C)     FHT: 135  Lab orders placed from triage:

## 2024-11-09 NOTE — Discharge Instructions (Signed)
 You were seen in the MAU for swelling of your right foot that had resolved. You have a normal exam. At this time our suspicion for a blood clot is very very low and no additional workup is needed. Continue routine prenatal care.  We also discussed your acid reflux, and I have updated your prescription so you can take your acid medicine twice a day.

## 2024-11-13 ENCOUNTER — Encounter (HOSPITAL_COMMUNITY): Payer: Self-pay | Admitting: Student

## 2024-11-13 ENCOUNTER — Inpatient Hospital Stay (HOSPITAL_COMMUNITY)

## 2024-11-13 ENCOUNTER — Telehealth (HOSPITAL_COMMUNITY): Payer: Self-pay | Admitting: *Deleted

## 2024-11-13 ENCOUNTER — Encounter (HOSPITAL_COMMUNITY): Payer: Self-pay | Admitting: *Deleted

## 2024-11-13 ENCOUNTER — Inpatient Hospital Stay (HOSPITAL_COMMUNITY)
Admission: AD | Admit: 2024-11-13 | Discharge: 2024-11-13 | Disposition: A | Discharge status: Home / Self Care | Attending: Student | Admitting: Student

## 2024-11-13 ENCOUNTER — Other Ambulatory Visit: Payer: Self-pay

## 2024-11-13 DIAGNOSIS — O403XX0 Polyhydramnios, third trimester, not applicable or unspecified: Secondary | ICD-10-CM | POA: Diagnosis not present

## 2024-11-13 DIAGNOSIS — Z3A38 38 weeks gestation of pregnancy: Secondary | ICD-10-CM

## 2024-11-13 DIAGNOSIS — O368130 Decreased fetal movements, third trimester, not applicable or unspecified: Secondary | ICD-10-CM | POA: Diagnosis not present

## 2024-11-13 DIAGNOSIS — Z3689 Encounter for other specified antenatal screening: Secondary | ICD-10-CM

## 2024-11-13 DIAGNOSIS — N898 Other specified noninflammatory disorders of vagina: Secondary | ICD-10-CM | POA: Diagnosis not present

## 2024-11-13 DIAGNOSIS — O26893 Other specified pregnancy related conditions, third trimester: Secondary | ICD-10-CM

## 2024-11-13 LAB — WET PREP, GENITAL
Clue Cells Wet Prep HPF POC: NONE SEEN
Sperm: NONE SEEN
Trich, Wet Prep: NONE SEEN
WBC, Wet Prep HPF POC: >=10 — AB (ref <10)
Yeast Wet Prep HPF POC: NONE SEEN

## 2024-11-13 LAB — GC/CHLAMYDIA PROBE AMP (~~LOC~~) NOT AT ARMC
Chlamydia: NEGATIVE
Comment: NEGATIVE
Comment: NORMAL
Neisseria Gonorrhea: NEGATIVE

## 2024-11-13 NOTE — MAU Note (Addendum)
 Megan Owens is a 33 y.o. at [redacted]w[redacted]d here in MAU reporting: an increase in clear, vaginal d/c and decreased FM. Pt states she lost her mucous plug 3 times today and has not felt baby move at all today. Pt reports no VB or S/Sx of PIH. .  LMP:  Onset of complaint: 1000 11/12/2024 Pain score: 0  FHT: pt by-passed triage and placed in MAU exam room   Lab orders placed from triage: labor eval

## 2024-11-13 NOTE — MAU Provider Note (Signed)
 S/HPI [name] is a 33 y.o. [phone] patient who presents to MAU today with complaint of reporting she is [redacted] weeks pregnant and has been having an increase in clear vaginal discharge along with decreased fetal movement today.    Patient states she lost her mucous plug today and since then she has felt less fetal movement.  Patient reports no vaginal bleeding, itching, irritation or regular contractions.  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    Appearance: Normal appearance. She is not ill-appearing.  HENT:     Head: Normocephalic.  Cardiovascular:     Rate and Rhythm: Normal rate.  Pulmonary:     Effort: Pulmonary effort is normal.  Abdominal:     General: There is no distension.     Palpations: Abdomen is soft.  Musculoskeletal:        General: Normal range of motion.     Cervical back: Normal range of motion.  Skin:    General: Skin is warm.  Neurological:     Mental Status: She is alert and oriented to person, place, and time.  Psychiatric:        Mood and Affect: Mood normal.        Behavior: Behavior normal.      FHR: Reactive NST Baseline: 130 Variability: moderate  Accelerations: present Decelerations: absent Toco: irregular ctx unappreciated by patient  MDM  MODERATE/HIGH  Prenatal chart reviewed Physical exam performed Vaginal swabs: Pending at discharge Odetta test is negative NST for fetal reassurance and gestational age also for report of decreased fetal movement (NST reactive, although patient still not able to feel baby move) BPP ordered 8/8 with polyhydramnios @ 26.8 ( Preliminary  report) After patient returned from [facility] still reporting DFM - Will plan for fetal kick counts and OB f/u NST in office 11/16/24 with strict return precautions and reassurance provided.   This case was discussed with Dr [facility] MAU Attending) Okay for discharge  Will make Private OB Attending aware ( DR Claudene) of polyhydramnios  since close f/u needed and delivery recommendations would be at 39 weeks    Orders Placed This Encounter  Procedures   Wet prep, genital    Standing Status:   Standing    Number of Occurrences:   1   OB RESULT CONSOLE Group B Strep    This external order was created through the Results Console.   US  MFM FETAL BPP WO NON STRESS    Standing Status:   Standing    Number of Occurrences:   1    Symptom/Reason for Exam:   Decreased fetal movement [[id number]]   Contraction - monitoring    Standing Status:   Standing    Number of Occurrences:   1   External fetal heart monitoring    Standing Status:   Standing    Number of Occurrences:   1   Vaginal exam    Standing Status:   Standing    Number of Occurrences:   1   POCT fern test    If suspected rupture of  membranes and/or if amniotic fluid leakage is present    Standing Status:   Standing    Number of Occurrences:   1       I have reviewed the patient chart and performed the physical exam . I have ordered & interpreted the lab results and reviewed and interpreted the NST Medications ordered as stated below.  A/P as described below.  Counseling and education provided and patient agreeable  with plan as described below. Verbalized understanding.    ASSESSMENT Medical screening exam complete  Polyhydramnios affecting pregnancy in third trimester  Decreased fetal movements in third trimester, single or unspecified fetus  [redacted] weeks gestation of pregnancy  NST (non-stress test) reactive on fetal surveillance  Vaginal discharge during pregnancy in third trimester    PLAN  F/U in the office Monday  12/8 ( New DX polyhydramnios at 38 weeks)  Fetal kick counts  Discharge from MAU in stable condition  See AVS for full description of educational information and instructions provided to the patient at time of discharge  Warning signs for worsening condition that would warrant emergency follow-up discussed  Patient may return to MAU as needed   Littie Olam LABOR, NP 11/13/2024 6:18 AM   This chart was dictated using voice recognition software, Dragon. Despite the best efforts of this provider to proofread and correct errors, errors may still occur which can change documentation meaning.

## 2024-11-13 NOTE — Telephone Encounter (Signed)
 Preadmission screen

## 2024-11-16 ENCOUNTER — Inpatient Hospital Stay (HOSPITAL_COMMUNITY)
Admission: AD | Admit: 2024-11-16 | Discharge: 2024-11-19 | DRG: 806 | Disposition: A | Attending: Obstetrics and Gynecology | Admitting: Obstetrics and Gynecology

## 2024-11-16 ENCOUNTER — Inpatient Hospital Stay (HOSPITAL_COMMUNITY): Admitting: Anesthesiology

## 2024-11-16 ENCOUNTER — Encounter (HOSPITAL_COMMUNITY): Payer: Self-pay | Admitting: Obstetrics and Gynecology

## 2024-11-16 DIAGNOSIS — O4292 Full-term premature rupture of membranes, unspecified as to length of time between rupture and onset of labor: Principal | ICD-10-CM

## 2024-11-16 DIAGNOSIS — B951 Streptococcus, group B, as the cause of diseases classified elsewhere: Secondary | ICD-10-CM

## 2024-11-16 DIAGNOSIS — Z3493 Encounter for supervision of normal pregnancy, unspecified, third trimester: Secondary | ICD-10-CM

## 2024-11-16 LAB — CBC
HCT: 36.8 % (ref 36.0–46.0)
Hemoglobin: 12.5 g/dL (ref 12.0–15.0)
MCH: 30.3 pg (ref 26.0–34.0)
MCHC: 34 g/dL (ref 30.0–36.0)
MCV: 89.1 fL (ref 80.0–100.0)
Platelets: 156 K/uL (ref 150–400)
RBC: 4.13 MIL/uL (ref 3.87–5.11)
RDW: 12.7 % (ref 11.5–15.5)
WBC: 8.2 K/uL (ref 4.0–10.5)
nRBC: 0 % (ref 0.0–0.2)

## 2024-11-16 LAB — TYPE AND SCREEN
ABO/RH(D): A POS
Antibody Screen: NEGATIVE

## 2024-11-16 LAB — RUPTURE OF MEMBRANE (ROM)PLUS: Rom Plus: POSITIVE

## 2024-11-16 LAB — WET PREP, GENITAL
Clue Cells Wet Prep HPF POC: NONE SEEN
Sperm: NONE SEEN
Trich, Wet Prep: NONE SEEN
WBC, Wet Prep HPF POC: <10 (ref <10)
Yeast Wet Prep HPF POC: NONE SEEN

## 2024-11-16 LAB — POCT FERN TEST: POCT Fern Test: POSITIVE

## 2024-11-16 MED ORDER — PANTOPRAZOLE SODIUM 40 MG PO TBEC
40.0000 mg | DELAYED_RELEASE_TABLET | Freq: Every day | ORAL | Status: DC
  Administered 2024-11-16 – 2024-11-18 (×3): 40 mg via ORAL
  Filled 2024-11-16 (×3): qty 1

## 2024-11-16 MED ORDER — LIDOCAINE HCL (PF) 1 % IJ SOLN
INTRAMUSCULAR | Status: DC | PRN
  Administered 2024-11-16 (×2): 4 mL via EPIDURAL

## 2024-11-16 MED ORDER — LIDOCAINE HCL (PF) 1 % IJ SOLN: 30.0000 mL | INTRAMUSCULAR | Status: DC | PRN

## 2024-11-16 MED ORDER — OXYTOCIN-SODIUM CHLORIDE 30-0.9 UT/500ML-% IV SOLN
2.5000 [IU]/h | INTRAVENOUS | Status: DC
  Administered 2024-11-17: 2.5 [IU]/h via INTRAVENOUS

## 2024-11-16 MED ORDER — SOD CITRATE-CITRIC ACID 500-334 MG/5ML PO SOLN: 30.0000 mL | ORAL | Status: DC | PRN

## 2024-11-16 MED ORDER — LACTATED RINGERS IV SOLN: 500.0000 mL | INTRAVENOUS | Status: DC | PRN

## 2024-11-16 MED ORDER — OXYCODONE-ACETAMINOPHEN 5-325 MG PO TABS: 2.0000 | ORAL_TABLET | ORAL | Status: DC | PRN

## 2024-11-16 MED ORDER — PHENYLEPHRINE 80 MCG/ML (10ML) SYRINGE FOR IV PUSH (FOR BLOOD PRESSURE SUPPORT): 80.0000 ug | PREFILLED_SYRINGE | INTRAVENOUS | Status: DC | PRN

## 2024-11-16 MED ORDER — LACTATED RINGERS IV SOLN: INTRAVENOUS | Status: DC

## 2024-11-16 MED ORDER — PHENYLEPHRINE 80 MCG/ML (10ML) SYRINGE FOR IV PUSH (FOR BLOOD PRESSURE SUPPORT)
80.0000 ug | PREFILLED_SYRINGE | INTRAVENOUS | Status: DC | PRN
  Filled 2024-11-16: qty 10

## 2024-11-16 MED ORDER — PENICILLIN G POT IN DEXTROSE 60000 UNIT/ML IV SOLN
3.0000 10*6.[IU] | INTRAVENOUS | Status: DC
  Administered 2024-11-16 – 2024-11-17 (×3): 3 10*6.[IU] via INTRAVENOUS
  Filled 2024-11-16 (×3): qty 50

## 2024-11-16 MED ORDER — ONDANSETRON HCL 4 MG/2ML IJ SOLN
4.0000 mg | Freq: Four times a day (QID) | INTRAMUSCULAR | Status: DC | PRN
  Administered 2024-11-17 (×2): 4 mg via INTRAVENOUS
  Filled 2024-11-16 (×2): qty 2

## 2024-11-16 MED ORDER — DIPHENHYDRAMINE HCL 50 MG/ML IJ SOLN
12.5000 mg | INTRAMUSCULAR | Status: DC | PRN
  Administered 2024-11-17 (×2): 12.5 mg via INTRAVENOUS
  Filled 2024-11-16 (×2): qty 1

## 2024-11-16 MED ORDER — OXYCODONE-ACETAMINOPHEN 5-325 MG PO TABS: 1.0000 | ORAL_TABLET | ORAL | Status: DC | PRN

## 2024-11-16 MED ORDER — SODIUM CHLORIDE 0.9 % IV SOLN
5.0000 10*6.[IU] | Freq: Once | INTRAVENOUS | Status: AC
  Administered 2024-11-16: 5 10*6.[IU] via INTRAVENOUS
  Filled 2024-11-16: qty 5

## 2024-11-16 MED ORDER — TERBUTALINE SULFATE 1 MG/ML IJ SOLN
0.2500 mg | Freq: Once | INTRAMUSCULAR | Status: DC | PRN
  Filled 2024-11-16: qty 1

## 2024-11-16 MED ORDER — LACTATED RINGERS IV SOLN: 500.0000 mL | Freq: Once | INTRAVENOUS | Status: DC

## 2024-11-16 MED ORDER — FENTANYL-BUPIVACAINE-NACL 0.5-0.125-0.9 MG/250ML-% EP SOLN
12.0000 mL/h | EPIDURAL | Status: DC | PRN
  Administered 2024-11-16: 12 mL/h via EPIDURAL
  Filled 2024-11-16: qty 250

## 2024-11-16 MED ORDER — OXYTOCIN BOLUS FROM INFUSION
333.0000 mL | Freq: Once | INTRAVENOUS | Status: AC
  Administered 2024-11-17: 333 mL via INTRAVENOUS

## 2024-11-16 MED ORDER — EPHEDRINE 5 MG/ML INJ: 10.0000 mg | INTRAVENOUS | Status: DC | PRN

## 2024-11-16 MED ORDER — OXYTOCIN-SODIUM CHLORIDE 30-0.9 UT/500ML-% IV SOLN
1.0000 m[IU]/min | INTRAVENOUS | Status: DC
  Administered 2024-11-16: 2 m[IU]/min via INTRAVENOUS
  Filled 2024-11-16: qty 500

## 2024-11-16 MED ORDER — ACETAMINOPHEN 325 MG PO TABS: 650.0000 mg | ORAL_TABLET | ORAL | Status: DC | PRN

## 2024-11-16 NOTE — Anesthesia Procedure Notes (Signed)
 Epidural Patient location during procedure: OB Start time: 11/16/2024 9:38 PM End time: 11/16/2024 9:43 PM  Staffing Anesthesiologist: Peggye Delon Brunswick, MD Performed: anesthesiologist   Preanesthetic Checklist Completed: patient identified, IV checked, risks and benefits discussed, monitors and equipment checked, pre-op evaluation and timeout performed  Epidural Patient position: sitting Prep: DuraPrep and site prepped and draped Patient monitoring: continuous pulse ox and blood pressure Approach: midline Location: L3-L4 Injection technique: LOR saline  Needle:  Needle type: Tuohy  Needle gauge: 17 G Needle length: 9 cm and 9 Needle insertion depth: 6 cm Catheter type: closed end flexible Catheter size: 19 Gauge Catheter at skin depth: 10 cm Test dose: negative  Assessment Events: blood not aspirated, no cerebrospinal fluid, injection not painful, no injection resistance, no paresthesia and negative IV test  Additional Notes The patient has requested an epidural for labor pain management. Risks and benefits including, but not limited to, infection, bleeding, local anesthetic toxicity, headache, hypotension, back pain, block failure, etc. were discussed with the patient. The patient expressed understanding and consented to the procedure. I confirmed that the patient has no bleeding disorders and is not taking blood thinners. I confirmed the patient's last platelet count with the nurse. A time-out was performed immediately prior to the procedure. Please see nursing documentation for vital signs. Sterile technique was used throughout the whole procedure. Once LOR achieved, the epidural catheter threaded easily without resistance. Aspiration of the catheter was negative for blood and CSF. The epidural was dosed slowly and an infusion was started.  1 attempt(s)Reason for block:procedure for pain

## 2024-11-16 NOTE — MAU Note (Signed)
 Megan Owens is a 33 y.o. at [redacted]w[redacted]d here in MAU reporting: sent from office for evaluation of AFI. On Friday AFI was 27 on Friday (in MAU) Checked level in office today and if was 11. Provider concerned she is leaking fluid. Pt denies and gushes of fluid a few spots of wetness in underwear. Good fetal movement reported. 3cm dilation today . Reports mild contractions   LMP:  Onset of complaint: today Pain score: 3 Vitals:   11/16/24 1550  BP: 125/75  Pulse: 94  Resp: 18  Temp: 98.4 F (36.9 C)     FHT: 135  Lab orders placed from triage: FERN. ROM plus

## 2024-11-16 NOTE — MAU Provider Note (Cosign Needed Addendum)
 Chief Complaint:  Rupture of Membranes   HPI   Megan Owens is a 33 y.o. G3P1011 at [redacted]w[redacted]d who presents to maternity admissions reporting she was sent from OB's office for evaluation of possible ROM. Patient denies active LOF, VB, reports mild CTX and reports fetal movements. AFI by MFM sono on  11/13/24 was 26.8 c/w poly and today provider in office performed a BSUS with AFI of 11 therefore she sent for evaluation of SROM. Patient was examined in office and her membranes were stripped. SVE in office was 3 cm  Pregnancy Course: [place]  Past Medical History:  Diagnosis Date   Acid reflux    Anemia    Anxiety    Bipolar 1 disorder (HCC)    Bipolar disorder (HCC)    pt stated this is wrong; she has ADHD   Hernia, hiatal    HSV infection    last outbreak April 2020; taking Valtrex  for past month according to patient   Macular degeneration    Ovarian cyst    Vaginal Pap smear, abnormal    OB History  Gravida Para Term Preterm AB Living  3 1 1  0 1 1  SAB IAB Ectopic Multiple Live Births  0 1 0 0 1    # Outcome Date GA Lbr Len/2nd Weight Sex Type Anes PTL Lv  3 Current           2 IAB 2024          1 Term 08/15/19 [redacted]w[redacted]d 04:30 / 01:03 3549 g F Vag-Spont EPI  LIV   Past Surgical History:  Procedure Laterality Date   DILATATION & CURETTAGE/HYSTEROSCOPY WITH MYOSURE N/A 03/13/2024   Procedure: DILATATION & CURETTAGE/HYSTEROSCOPY WITH  MYOSURE;  Surgeon: Sudie Lavonia HERO, MD;  Location: [facility] OR;  Service: Gynecology;  Laterality: N/A;   ESOPHAGOGASTRODUODENOSCOPY ENDOSCOPY     TYMPANOSTOMY TUBE PLACEMENT     WISDOM TOOTH EXTRACTION     Family History  Problem Relation Age of Onset   Diabetes Mother    Hypertension Father    Diabetes Maternal Grandmother    Diabetes Paternal Grandfather    Social History   Tobacco Use   Smoking status: Never   Smokeless tobacco: Never  Vaping Use   Vaping status: Never Used  Substance Use Topics   Alcohol use: No   Drug use: Not Currently     Types: Marijuana   Allergies  Allergen Reactions   Egg Protein-Containing Drug Products     Like scrambled eggs or just egg cause stomach pains     Medications Prior to Admission  Medication Sig Dispense Refill Last Dose/Taking   Prenatal Vit-Fe Fumarate-FA (MULTIVITAMIN-PRENATAL) 27-0.8 MG TABS tablet Take 1 tablet by mouth daily at 12 noon.   11/16/2024   cyclobenzaprine  (FLEXERIL ) 10 MG tablet Take 1 tablet (10 mg total) by mouth 2 (two) times daily as needed for muscle spasms. 20 tablet 0    pantoprazole  (PROTONIX ) 40 MG tablet Take 1 tablet (40 mg total) by mouth 2 (two) times daily before a meal. 60 tablet 1    Serdexmethylphen-Dexmethylphen (AZSTARYS) 26.1-5.2 MG CAPS Take 1 capsule by mouth daily.      valACYclovir  (VALTREX ) 500 MG tablet Take 1 tablet (500 mg total) by mouth 2 (two) times daily. (Patient taking differently: Take 500 mg by mouth 2 (two) times daily as needed (breakouts).) 30 tablet 3     I have reviewed patient's Past Medical Hx, Surgical Hx, Family Hx, Social Hx, medications and  allergies.   ROS  Pertinent items noted in HPI and remainder of comprehensive ROS otherwise negative.   PHYSICAL EXAM  Patient Vitals for the past 24 hrs:  BP Temp Pulse Resp Height Weight  11/16/24 1550 125/75 98.4 F (36.9 C) 94 18 5' 5 (1.651 m) 80.7 kg    Constitutional: Well-developed, well-nourished female in no acute distress.  Cardiovascular: normal rate & rhythm, warm and well-perfused Respiratory: normal effort, no problems with respiration noted GI: Abd soft, non-tender, gravid MS: Extremities nontender, no edema, normal ROM Neurologic: Alert and oriented x 4.  Pelvic: exam chaperoned by Nathanel Blush RN No external lesions visualized  No pooling, bloody discharge in the vaginal vault noted, vaginal swabs obtained with ROM Plus, cervix visually soft and bleeds to touch SVE repeated at 3 cm ( Unchanged)     Fetal Tracing: NST Baseline: 130 Variability:  moderate Accelerations: present Decelerations: absent Toco: ctx q 3-5 irreg   Labs: Results for orders placed or performed during the hospital encounter of 11/16/24 (from the past 24 hours)  Fern Test     Status: None (In process)   Collection Time: 11/16/24  4:15 PM  Result Value Ref Range   POCT Fern Test Positive = ruptured amniotic membanes     Imaging:  No results found.  MDM & MAU COURSE  MDM:  HIGH- Admit to LD for PROM at [redacted]w[redacted]d    MAU Course: Orders Placed This Encounter  Procedures   Wet prep, genital   Rupture of Membrane (ROM) Plus   Fern Test    I have reviewed the patient chart and performed the physical exam . I have ordered & interpreted the lab results and reviewed and interpreted the NST Medications ordered as stated below.  A/P as described below.  Counseling and education provided and patient agreeable  with plan as described below. Verbalized understanding.    ASSESSMENT   1. Full-term premature rupture of membranes, unspecified duration to onset of labor   2. Positive GBS test     PLAN   Admit to LD for IOL   GBS positive for ppx Admission orders to follow Notified Dr Claire [facility] private Attending)  -------------------------------------------------------------------------- Olam Dalton, MSN, [facility], [facility]    This chart was dictated using voice recognition software, Dragon. Despite the best efforts of this provider to proofread and correct errors, errors may still occur which can change documentation meaning.

## 2024-11-16 NOTE — H&P (Signed)
 MARSHAL SCHRECENGOST is a 33 y.o. [phone] female at [redacted]w[redacted]d presenting for rule out rupture of membranes, found to be ruptured. She was sent from [facility] office as she reported a consistent trickle of fluid since 12/5 or 12/6. AFI by MFM US  on 11/13/24 was 26.8cm, c/w poly, and today Dr. Sudie performed a BSUS with AFI of 11. Patient denies active LOF or VB. She reports mild CTX and normal fetal movements.  Patient was examined in office, SVE 3cm, and her membranes were stripped. She denies any vulvar lesions or HSV prodromal symptoms.  Her pregnancy is otherwise complicated by: -polyhydramnios: noted on 12/5 US  -gestational thrombocytopenia: platelets wnl (150) on 11/4 -GBS positive -genital herpes: on suppression -large for gestational age: US  on 11/14 showed 3220g/7lb2oz/86th%tile, AC @ 99th  OB History     Gravida  3   Para  1   Term  1   Preterm  0   AB  1   Living  1      SAB  0   IAB  1   Ectopic  0   Multiple  0   Live Births  1          Past Medical History:  Diagnosis Date   Acid reflux    Anemia    Anxiety    Bipolar 1 disorder (HCC)    Bipolar disorder (HCC)    pt stated this is wrong; she has ADHD   Hernia, hiatal    HSV infection    last outbreak April 2020; taking Valtrex  for past month according to patient   Macular degeneration    Ovarian cyst    Vaginal Pap smear, abnormal    Past Surgical History:  Procedure Laterality Date   DILATATION & CURETTAGE/HYSTEROSCOPY WITH MYOSURE N/A 03/13/2024   Procedure: DILATATION & CURETTAGE/HYSTEROSCOPY WITH  MYOSURE;  Surgeon: Sudie Lavonia HERO, MD;  Location: [facility] OR;  Service: Gynecology;  Laterality: N/A;   ESOPHAGOGASTRODUODENOSCOPY ENDOSCOPY     TYMPANOSTOMY TUBE PLACEMENT     WISDOM TOOTH EXTRACTION     Family History: family history includes Diabetes in her maternal grandmother, mother, and paternal grandfather; Hypertension in her father. Social History:  reports that she has never smoked. She  has never used smokeless tobacco. She reports that she does not currently use drugs after having used the following drugs: Marijuana. She reports that she does not drink alcohol.     Maternal Diabetes: No Genetic Screening: Normal Maternal Ultrasounds/Referrals: Normal and Other: 5.69mm hyperechoic focus noted on fetal surface of placenta on anatomy US  Fetal Ultrasounds or other Referrals:  None Maternal Substance Abuse:  No Significant Maternal Medications:  Meds include: Other: omeprazole, pantoprazole , valtrex  Significant Maternal Lab Results:  Group B Strep positive Number of Prenatal Visits:greater than 3 verified prenatal visits Maternal Vaccinations:TDap   Review of Systems  All other systems reviewed and are negative.  Maternal Medical History:  Reason for admission: Rupture of membranes.   Fetal activity: Perceived fetal activity is normal.   Prenatal complications: Infection, placental abnormality, polyhydramnios and thrombocytopenia.   No bleeding, cholelithiasis, HIV, PIH, IUGR, nephrolithiasis, oligohydramnios, pre-eclampsia, preterm labor, substance abuse or thrombophilia.   Prenatal Complications - Diabetes: none.     Blood pressure 125/75, pulse 94, temperature 98.4 F (36.9 C), resp. rate 18, height 5' 5 (1.651 m), weight 80.7 kg, last menstrual period 02/21/2024. Maternal Exam:  Abdomen: Patient reports no abdominal tenderness. Estimated fetal weight is 8.5lb.   Fetal presentation: vertex Introitus: Normal  vulva. Sterile speculum exam performed by [facility]U provider, negative for HSV lesions Pelvis: adequate for delivery.   Cervix: Cervix evaluated by digital exam.     Fetal Exam Fetal Monitor Review: Baseline rate: 130.  Variability: moderate (6-25 bpm).   Pattern: accelerations present and no decelerations.   Fetal State Assessment: Category I - tracings are normal.   Physical Exam Vitals reviewed.  Constitutional:      Appearance: Normal appearance.   HENT:     Head: Normocephalic.     Nose: Nose normal.  Eyes:     Extraocular Movements: Extraocular movements intact.  Cardiovascular:     Comments: Well perfused Pulmonary:     Effort: Pulmonary effort is normal.  Abdominal:     Comments: Gravid, non-tender  Genitourinary:    General: Normal vulva.  Musculoskeletal:        General: Normal range of motion.     Cervical back: Normal range of motion.  Skin:    General: Skin is warm and dry.  Neurological:     General: No focal deficit present.     Mental Status: She is alert and oriented to person, place, and time.  Psychiatric:        Mood and Affect: Mood normal.        Behavior: Behavior normal.        Thought Content: Thought content normal.        Judgment: Judgment normal.     Prenatal labs: ABO, Rh:   Antibody: Negative (05/28 0000) Rubella: Immune (05/28 0000) RPR: Nonreactive (05/28 0000)  HBsAg: Negative (05/28 0000)  HIV: Non-reactive (05/28 0000)  GBS: Positive/-- (11/25 0000)   Assessment/Plan: CHERELL COLVIN is a 33 y.o. [id number] female at [redacted]w[redacted]d presenting for rule out rupture of membranes, found to be ruptured.  -PROM/IOL: Possible prolonged rupture, asx of infection and afebrile. SVE 4cm on admission. Plan pitocin  per protocol. Patient desires nitrous followed by potential epidural for pain control. Patient desires to push on her side for second stage of labor.  -FWB: Category 1 -polyhydramnios: noted on 12/5 US  -gestational thrombocytopenia: Resolved on admit (156) -GBS positive: PCN ordered -genital herpes: on suppression. Neg SSE. -large for gestational age: US  on 11/14 showed 3220g/7lb2oz/86th%tile, AC @ 99th. Leopolds 8.5-9lbs.   Dispo: Anticipate SVD. Expecting baby boy [employer] Circ desired.    Kenika Sahm A Uma Jerde 11/16/2024, 5:24 PM

## 2024-11-16 NOTE — Progress Notes (Signed)
 Megan Owens is a 33 y.o. G3P1011 at [redacted]w[redacted]d undergoing IOL for prelabor rupture of membranes  Subjective: Now comfortable with epidural  Objective:    11/16/2024   10:01 PM 11/16/2024    9:54 PM 11/16/2024    9:43 PM  Vitals with BMI  Systolic 103 112 878  Diastolic 53 66 44  Pulse 94 96 96    FHT:  FHR: 135 bpm, variability: moderate,  accelerations:  Present,  decelerations:  Present prolonged after AROM of forebag, resolved with position changes (prior prolonged at 21:20 was maternal HR per bedside RN), lates (resolved with position changes) UC:   regular, every 3-4 minutes SVE:   Dilation: 5 Effacement (%): 50 Station: -2 Exam by:: Amiir Heckard MD  Labs: Lab Results  Component Value Date   WBC 8.2 11/16/2024   HGB 12.5 11/16/2024   HCT 36.8 11/16/2024   MCV 89.1 11/16/2024   PLT 156 11/16/2024    Assessment / Plan: Megan Owens is a 33 y.o. G47P1021 female at [redacted]w[redacted]d undergoing IOL for PROM. -PROM/IOL: Possible prolonged rupture, asx of infection and afebrile. SVE 4cm on admission, repeat unchanged 4 hours later. AROM of forebag with copious clear fluid. Continue pitocin  per protocol. Comfortable with epidural. -FWB: Overall Category 1 (non-recurrent lates and prolonged decel after AROM of forebag resolved with position changes) -polyhydramnios: noted on 12/5 US  -gestational thrombocytopenia: Resolved on admit (156) -GBS positive: PCN ordered -genital herpes: on suppression. Neg SSE. -large for gestational age: US  on 11/14 showed 3220g/7lb2oz/86th%tile, AC @ 99th. Leopolds 8.5-9lbs.        Dispo: Anticipate SVD. Expecting baby boy [employer] Circ desired.  Megan DELENA Husky, MD 11/16/2024, 10:29 PM

## 2024-11-16 NOTE — Anesthesia Preprocedure Evaluation (Signed)
 Anesthesia Evaluation  Patient identified by MRN, date of birth, ID band Patient awake    Reviewed: Allergy & Precautions, NPO status , Patient's Chart, lab work & pertinent test results  History of Anesthesia Complications Negative for: history of anesthetic complications  Airway Mallampati: III  TM Distance: >3 FB Neck ROM: Full    Dental   Pulmonary neg pulmonary ROS, Patient abstained from smoking.   Pulmonary exam normal breath sounds clear to auscultation       Cardiovascular negative cardio ROS  Rhythm:Regular Rate:Normal     Neuro/Psych  PSYCHIATRIC DISORDERS Anxiety  Bipolar Disorder   negative neurological ROS     GI/Hepatic Neg liver ROS, hiatal hernia,GERD  Medicated,,  Endo/Other  negative endocrine ROS    Renal/GU negative Renal ROS     Musculoskeletal   Abdominal   Peds  Hematology  (+) Blood dyscrasia, anemia Lab Results      Component                Value               Date                      WBC                      8.2                 11/16/2024                HGB                      12.5                11/16/2024                HCT                      36.8                11/16/2024                MCV                      89.1                11/16/2024                PLT                      156                 11/16/2024              Anesthesia Other Findings   Reproductive/Obstetrics (+) Pregnancy                              Anesthesia Physical Anesthesia Plan  ASA: 2  Anesthesia Plan: Epidural   Post-op Pain Management:    Induction:   PONV Risk Score and Plan:   Airway Management Planned: Natural Airway  Additional Equipment:   Intra-op Plan:   Post-operative Plan:   Informed Consent: I have reviewed the patients History and Physical, chart, labs and discussed the procedure including the risks, benefits and alternatives for the proposed  anesthesia with the patient or authorized representative who  has indicated his/her understanding and acceptance.       Plan Discussed with: Anesthesiologist  Anesthesia Plan Comments: (I have discussed risks of neuraxial anesthesia including but not limited to infection, bleeding, nerve injury, back pain, headache, seizures, and failure of block. Patient denies bleeding disorders and is not currently anticoagulated. Labs have been reviewed. Risks and benefits discussed. All patient's questions answered.  )        Anesthesia Quick Evaluation

## 2024-11-17 ENCOUNTER — Encounter (HOSPITAL_COMMUNITY): Payer: Self-pay | Admitting: Obstetrics and Gynecology

## 2024-11-17 LAB — GC/CHLAMYDIA PROBE AMP (~~LOC~~) NOT AT ARMC
Chlamydia: NEGATIVE
Comment: NEGATIVE
Comment: NORMAL
Neisseria Gonorrhea: NEGATIVE

## 2024-11-17 LAB — SYPHILIS: RPR W/REFLEX TO RPR TITER AND TREPONEMAL ANTIBODIES, TRADITIONAL SCREENING AND DIAGNOSIS ALGORITHM: RPR Ser Ql: NONREACTIVE

## 2024-11-17 MED ORDER — ONDANSETRON HCL 4 MG PO TABS: 4.0000 mg | ORAL_TABLET | ORAL | Status: DC | PRN

## 2024-11-17 MED ORDER — SIMETHICONE 80 MG PO CHEW: 80.0000 mg | CHEWABLE_TABLET | ORAL | Status: DC | PRN

## 2024-11-17 MED ORDER — IBUPROFEN 600 MG PO TABS
600.0000 mg | ORAL_TABLET | Freq: Four times a day (QID) | ORAL | Status: DC
  Administered 2024-11-17 – 2024-11-19 (×9): 600 mg via ORAL
  Filled 2024-11-17 (×9): qty 1

## 2024-11-17 MED ORDER — TETANUS-DIPHTH-ACELL PERTUSSIS 5-2-15.5 LF-MCG/0.5 IM SUSP: 0.5000 mL | Freq: Once | INTRAMUSCULAR | Status: DC

## 2024-11-17 MED ORDER — ONDANSETRON HCL 4 MG/2ML IJ SOLN: 4.0000 mg | INTRAMUSCULAR | Status: DC | PRN

## 2024-11-17 MED ORDER — ZOLPIDEM TARTRATE 5 MG PO TABS: 5.0000 mg | ORAL_TABLET | Freq: Every evening | ORAL | Status: DC | PRN

## 2024-11-17 MED ORDER — SENNOSIDES-DOCUSATE SODIUM 8.6-50 MG PO TABS
2.0000 | ORAL_TABLET | Freq: Every day | ORAL | Status: DC
  Administered 2024-11-18 – 2024-11-19 (×2): 2 via ORAL
  Filled 2024-11-17 (×2): qty 2

## 2024-11-17 MED ORDER — SODIUM CHLORIDE 0.9 % AMNIOINFUSION: INTRAVENOUS | Status: DC

## 2024-11-17 MED ORDER — PRENATAL MULTIVITAMIN CH
1.0000 | ORAL_TABLET | Freq: Every day | ORAL | Status: DC
  Administered 2024-11-17 – 2024-11-19 (×3): 1 via ORAL
  Filled 2024-11-17 (×3): qty 1

## 2024-11-17 MED ORDER — DIBUCAINE (PERIANAL) 1 % EX OINT: 1.0000 | TOPICAL_OINTMENT | CUTANEOUS | Status: DC | PRN

## 2024-11-17 MED ORDER — DIPHENHYDRAMINE HCL 25 MG PO CAPS: 25.0000 mg | ORAL_CAPSULE | Freq: Four times a day (QID) | ORAL | Status: DC | PRN

## 2024-11-17 MED ORDER — ACETAMINOPHEN 325 MG PO TABS: 650.0000 mg | ORAL_TABLET | ORAL | Status: DC | PRN

## 2024-11-17 MED ORDER — WITCH HAZEL-GLYCERIN EX PADS: 1.0000 | MEDICATED_PAD | CUTANEOUS | Status: DC | PRN

## 2024-11-17 MED ORDER — COCONUT OIL OIL
1.0000 | TOPICAL_OIL | Status: DC | PRN
  Administered 2024-11-19: 1 via TOPICAL

## 2024-11-17 MED ORDER — BENZOCAINE-MENTHOL 20-0.5 % EX AERO
1.0000 | INHALATION_SPRAY | CUTANEOUS | Status: DC | PRN
  Administered 2024-11-17: 1 via TOPICAL
  Filled 2024-11-17: qty 56

## 2024-11-17 NOTE — Progress Notes (Signed)
 Megan Owens is a 33 y.o. G3P1011 at [redacted]w[redacted]d undergoing IOL for prelabor rupture of membranes  Subjective: CEFM review note. Alerted of tachysystole by bedside RN.  Objective:    11/17/2024    1:01 AM 11/17/2024   12:52 AM 11/17/2024   12:31 AM  Vitals with BMI  Systolic 121 118 891  Diastolic 72 93 50  Pulse 96 107 88    FHT:  FHR: 130 bpm, variability: moderate,  accelerations:  Present,  decelerations:  Present- prolonged during SVE; resolved with position change, earlies, occasional variable UC:   regular, every 2 minutes SVE:   Dilation: 6 Effacement (%): 60 Station: -1, 0 Exam by:: Valentin Ming RN  Labs: Lab Results  Component Value Date   WBC 8.2 11/16/2024   HGB 12.5 11/16/2024   HCT 36.8 11/16/2024   MCV 89.1 11/16/2024   PLT 156 11/16/2024    Assessment / Plan: Megan Owens is a 33 y.o. G94P1021 female at [redacted]w[redacted]d undergoing IOL for PROM. -PROM/IOL: Possible prolonged rupture, asx of infection and afebrile. S/p AROM of forebag with copious clear fluid. Will turn down pitocin  from 12 to 6mu/min for tachysystole. Comfortable with epidural. -FWB: Overall Category 2 but overall reassuring, decreasing pitocin  as above to address. -polyhydramnios: noted on 12/5 US  -gestational thrombocytopenia: Resolved on admit (156) -GBS positive: PCN ordered -genital herpes: on suppression. Neg SSE. -large for gestational age: US  on 11/14 showed 3220g/7lb2oz/86th%tile, AC @ 99th. Leopolds 8.5-9lbs.        Dispo: Anticipate SVD. Expecting baby boy [employer] Circ desired.  Megan DELENA Husky, MD 11/17/2024, 1:27 AM

## 2024-11-17 NOTE — Progress Notes (Signed)
 Megan Owens is a 33 y.o. G3P1011 at [redacted]w[redacted]d undergoing IOL for prelabor rupture of membranes  Subjective: Comfortable with epidural, feeling more rectal pressure  Objective:    11/17/2024    7:30 AM 11/17/2024    7:01 AM 11/17/2024    5:30 AM  Vitals with BMI  Systolic 107 127 879  Diastolic 53 75 73  Pulse 96 102     FHT:  FHR: 120 bpm, variability: moderate,  accelerations:  Present,  decelerations:  int variables. IUPC placed UC:   irregular, pitocin  at 9mU/min, q2-77min SVE:   Dilation: 8 Effacement (%): 70 Station: -1, 0 Exam by:: Dr. Sudie  Labs: Lab Results  Component Value Date   WBC 8.2 11/16/2024   HGB 12.5 11/16/2024   HCT 36.8 11/16/2024   MCV 89.1 11/16/2024   PLT 156 11/16/2024    Assessment / Plan: Megan Owens is a 34 y.o. G70P1021 female at [redacted]w[redacted]d undergoing IOL for PROM. -PROM/IOL: Possible prolonged rupture, asx of infection and afebrile. S/p AROM of forebag with copious clear fluid. Pitocin  turned down for fetal intolerance, has been at 55mU/min over 4hrs with reassuring tracing. Given CE of 7-cm, IUPC placed and will titrate pitocin  back up. Amnio ordered given intermittent variables. Comfortable with epidural. -FWB: Category 2 given one variables contractions in last however otherwise early decels, mod var, +accels, overall reassuring -polyhydramnios: noted on 12/5 US  -gestational thrombocytopenia: Resolved on admit (156) -GBS positive: PCN ordered -genital herpes: on suppression. Neg SSE. -large for gestational age: US  on 11/14 showed 3220g/7lb2oz/86th%tile, AC @ 99th. Leopolds 8.5-9lbs. -blood pressure elevations: occurred in the course of labor, most recently after discussion of potential C-section. Does not meet criteria for gHTN at this time, monitor.   Dispo: Discussed with patient arrest definitions of with and without adequate contractions. IUPC placed now at 0800, instructed on amnio and circuit. Close eye on status  Megan CHRISTELLA Sudie, MD 11/17/2024, 8:05 AM

## 2024-11-17 NOTE — Anesthesia Postprocedure Evaluation (Signed)
 Anesthesia Post Note  Patient: Megan Owens  Procedure(s) Performed: AN AD HOC LABOR EPIDURAL     Patient location during evaluation: Mother Baby Anesthesia Type: Epidural Level of consciousness: awake and alert Pain management: pain level controlled Vital Signs Assessment: post-procedure vital signs reviewed and stable Respiratory status: spontaneous breathing, nonlabored ventilation and respiratory function stable Cardiovascular status: stable Postop Assessment: no headache, no apparent nausea or vomiting, adequate PO intake and able to ambulate Anesthetic complications: no Comments: Complain of slight back pain 3/10   No notable events documented.  Last Vitals:  Vitals:   11/17/24 1410 11/17/24 1810  BP: 120/77 (!) 121/52  Pulse: 91 96  Resp: 18 18  Temp: 36.9 C 37 C  SpO2:      Last Pain:  Vitals:   11/17/24 1810  TempSrc: Oral  PainSc:    Pain Goal:                   Jalisha Enneking Adedayo Tawney Vanorman

## 2024-11-17 NOTE — Progress Notes (Signed)
 Megan Owens is a 33 y.o. G3P1011 at [redacted]w[redacted]d undergoing IOL for prelabor rupture of membranes  Subjective: Comfortable with epidural  Objective:    11/17/2024    5:00 AM 11/17/2024    4:31 AM 11/17/2024    4:01 AM  Vitals with BMI  Systolic 144 133 878  Diastolic 76 86 76  Pulse  103 88    FHT:  FHR: 130 bpm, variability: moderate,  accelerations:  Present,  decelerations:  Present- episode of recurrent lates from 4:30 to 4:50am, appears to have resolved with position change. Intermittent variables UC:   regular, every 2-3 minutes SVE:   Dilation: 7 Effacement (%): 70 Station: 0 Exam by:: Megan Dobias MD  Labs: Lab Results  Component Value Date   WBC 8.2 11/16/2024   HGB 12.5 11/16/2024   HCT 36.8 11/16/2024   MCV 89.1 11/16/2024   PLT 156 11/16/2024    Assessment / Plan: Megan Owens is a 33 y.o. G69P1021 female at [redacted]w[redacted]d undergoing IOL for PROM. -PROM/IOL: Possible prolonged rupture, asx of infection and afebrile. S/p AROM of forebag with copious clear fluid. Pitocin  turned down for fetal intolerance, now at 45mu/min. Comfortable with epidural. -FWB: Category 2 but with moderate variability; both late and variable decelerations appear responsive to position change and decreasing pitocin . However, if unable to continue to titrate pitocin  due to fetal status, will recommend a C-section.  -polyhydramnios: noted on 12/5 US  -gestational thrombocytopenia: Resolved on admit (156) -GBS positive: PCN ordered -genital herpes: on suppression. Neg SSE. -large for gestational age: US  on 11/14 showed 3220g/7lb2oz/86th%tile, AC @ 99th. Leopolds 8.5-9lbs. -blood pressure elevations: occurred in the course of labor, most recently after discussion of potential C-section. Does not meet criteria for gHTN at this time, monitor.   Dispo: Anticipate SVD vs C-section pending fetal tolerance of labor. Expecting baby boy [employer] Circ desired.  [name], MD 11/17/2024, 5:07 AM

## 2024-11-17 NOTE — Lactation Note (Signed)
 This note was copied from a baby's chart. Lactation Consultation Note  Patient Name: Megan Owens Date: 11/17/2024 Age:32 hours  Reason for consult: Initial assessment;Early term 37-38.6wks  P2, [redacted]w[redacted]d  Initial LC visit to see mother and baby Megan. Mother reports she breast fed her daughter for 1 year. She states this baby has been latching well but has been sleepy for the last two attempts.   Mother has a Medela breast pump at home and she requested 27 mm flanges. She reports she tried her 24 mm flanges and they were pinching. The 27 mm appears to have space around the base of the flange. Discussed flange fitting and milk production.   Mother encouraged to latch baby with feeding cues, place baby skin to skin if not latching, and call for assistance with breastfeeding as needed.   Handouts given and mother made aware of hospital associated O/P services, breastfeeding support groups, community resources, and  phone # for post-discharge questions.    Maternal Data Has patient been taught Hand Expression?: Yes Does the patient have breastfeeding experience prior to this delivery?: Yes How long did the patient breastfeed?: 1 year (child now 88 yo)  Feeding Mother's Current Feeding Choice: Breast Milk  LATCH Score  Not observed   Interventions Interventions: Breast feeding basics reviewed;Education;LC Services brochure;CDC milk storage guidelines;CDC Guidelines for Breast Pump Cleaning  Discharge Pump: DEBP;Personal;Hands Free (Medela and MomCozy)  Consult Status Consult Status: Follow-up Date: 11/18/24 Follow-up type: In-patient    Joshua Line M 11/17/2024, 7:30 PM

## 2024-11-18 ENCOUNTER — Other Ambulatory Visit: Payer: Self-pay

## 2024-11-18 LAB — CBC
HCT: 28.5 % — ABNORMAL LOW (ref 36.0–46.0)
Hemoglobin: 9.7 g/dL — ABNORMAL LOW (ref 12.0–15.0)
MCH: 30.8 pg (ref 26.0–34.0)
MCHC: 34 g/dL (ref 30.0–36.0)
MCV: 90.5 fL (ref 80.0–100.0)
Platelets: 119 K/uL — ABNORMAL LOW (ref 150–400)
RBC: 3.15 MIL/uL — ABNORMAL LOW (ref 3.87–5.11)
RDW: 13 % (ref 11.5–15.5)
WBC: 12.4 K/uL — ABNORMAL HIGH (ref 4.0–10.5)
nRBC: 0 % (ref 0.0–0.2)

## 2024-11-18 NOTE — Social Work (Signed)
 CSW received consult for hx of Anxiety and Bipolar.  CSW met with MOB to offer support and complete assessment.  CSW entered the room and observed MOB resting in bed and the infant in the bassinet. CSW introduced self, CSW role and reason for visit. MOB was agreeable to visit. CSW inquired about how MOB was feeling, MOB reported good. CSW inquired about MOB MH hx MOB stated I do not have Bipolar, that was a misdiagnosis I am only diagnosed with ADHD. CSW inquired about  ADHD treatment. MOB reported she was on Azstrays prior to pregnancy. MOB reported she has an appointment with her doctor in January about restarting the medication or being prescribed something safe to take while breastfeeding. CSW provided education regarding the baby blues period vs. perinatal mood disorders, discussed treatment and gave resources for mental health follow up if concerns arise.  CSW recommends self-evaluation during the postpartum time period using the New Mom Checklist from Postpartum Progress and encouraged MOB to contact a medical professional if symptoms are noted at any time.  MOB identified her mother and best friend as her primary supports.   CSW provided review of Sudden Infant Death Syndrome (SIDS) precautions.  MOB reported she has all essential items for the infant including a bassinet and car seat.  CSW identifies no further need for intervention and no barriers to discharge at this time.  Jenipher Havel, LCSWA Clinical Social Worker [phone]

## 2024-11-18 NOTE — Lactation Note (Signed)
 This note was copied from a baby's chart. Lactation Consultation Note  Patient Name: Megan Owens Date: 11/18/2024 Age:32 hours Reason for consult: Follow-up assessment;RN request;Early term 43-38.6wks  RN told LC that baby was latched so LC went to see feeding but baby had already finished. Mom stated he fed for 30 min. RN told LC that baby was retracting and having nasal congestion. LC noted that. Mom was resting holding baby STS on her chest. Asked mom if she would like LC to put baby in his basinet, mom stated yes. LC placed baby in bassinet. Baby having loud nasal congestion and retracting when breathing. Reported to RN he was did doing that. Mom stated he BF well.  Maternal Data    Feeding    LATCH Score Latch:  (didn't see latch)     Type of Nipple: Everted at rest and after stimulation  Comfort (Breast/Nipple): Soft / non-tender         Lactation Tools Discussed/Used    Interventions Interventions: Skin to skin  Discharge    Consult Status Consult Status: Follow-up Date: 11/18/24 Follow-up type: In-patient    [name] 11/18/2024, 4:03 AM

## 2024-11-18 NOTE — Plan of Care (Signed)
°  Problem: Health Behavior/Discharge Planning: Goal: Ability to manage health-related needs will improve Outcome: Progressing   Problem: Clinical Measurements: Goal: Ability to maintain clinical measurements within normal limits will improve Outcome: Progressing Goal: Will remain free from infection Outcome: Progressing Goal: Diagnostic test results will improve Outcome: Progressing Goal: Respiratory complications will improve Outcome: Progressing Goal: Cardiovascular complication will be avoided Outcome: Progressing   Problem: Coping: Goal: Level of anxiety will decrease Outcome: Progressing   Problem: Elimination: Goal: Will not experience complications related to bowel motility Outcome: Progressing   Problem: Safety: Goal: Ability to remain free from injury will improve Outcome: Progressing   Problem: Skin Integrity: Goal: Risk for impaired skin integrity will decrease Outcome: Progressing   Problem: Education: Goal: Knowledge of Childbirth will improve Outcome: Progressing Goal: Ability to make informed decisions regarding treatment and plan of care will improve Outcome: Progressing Goal: Ability to state and carry out methods to decrease the pain will improve Outcome: Progressing Goal: Individualized Educational Video(s) Outcome: Progressing   Problem: Coping: Goal: Ability to verbalize concerns and feelings about labor and delivery will improve Outcome: Progressing   Problem: Life Cycle: Goal: Ability to make normal progression through stages of labor will improve Outcome: Progressing Goal: Ability to effectively push during vaginal delivery will improve Outcome: Progressing   Problem: Role Relationship: Goal: Will demonstrate positive interactions with the child Outcome: Progressing   Problem: Safety: Goal: Risk of complications during labor and delivery will decrease Outcome: Progressing   Problem: Pain Management: Goal: Relief or control of pain  from uterine contractions will improve Outcome: Progressing   Problem: Education: Goal: Individualized Educational Video(s) Outcome: Progressing Goal: Individualized Newborn Educational Video(s) Outcome: Progressing   Problem: Activity: Goal: Will verbalize the importance of balancing activity with adequate rest periods Outcome: Progressing   Problem: Coping: Goal: Ability to identify and utilize available resources and services will improve Outcome: Progressing   Problem: Life Cycle: Goal: Chance of risk for complications during the postpartum period will decrease Outcome: Progressing   Problem: Role Relationship: Goal: Ability to demonstrate positive interaction with newborn will improve Outcome: Progressing

## 2024-11-18 NOTE — Progress Notes (Signed)
 Post Partum Day 1 Subjective: no complaints, up ad lib, voiding, and tolerating PO Baby was having some issues with breathing and had to be sent to the nursery for monitoring.  Objective: Blood pressure (!) 108/58, pulse 78, temperature 98.6 F (37 C), temperature source Oral, resp. rate 17, height 5' 5 (1.651 m), weight 80.7 kg, last menstrual period 02/21/2024, SpO2 100%, unknown if currently breastfeeding.  Physical Exam:  General: alert, cooperative, and appears stated age Lochia: appropriate Uterine Fundus: firm DVT Evaluation: No evidence of DVT seen on physical exam.  Recent Labs    11/16/24 1720 11/18/24 0433  HGB 12.5 9.7*  HCT 36.8 28.5*    Assessment/Plan: Plan for discharge tomorrow Breast-feeding Desires neonatal circumcision, R/B/A of procedure discussed at length. Pt understands that neonatal circumcision is not considered medically necessary and is elective. The risks include, but are not limited to bleeding, infection, damage to the penis, development of scar tissue, and having to have it redone at a later date. Pt understands theses risks and wishes to proceed.  Due to issues with breathing nursery requesting postponement of circumcision until tomorrow    LOS: 2 days   Marjorie Gull, MD 11/18/2024, 12:58 PM

## 2024-11-19 MED ORDER — IBUPROFEN 600 MG PO TABS: 600.0000 mg | ORAL_TABLET | Freq: Four times a day (QID) | ORAL | 1 refills | Status: AC

## 2024-11-19 NOTE — Lactation Note (Addendum)
 This note was copied from a baby's chart. Lactation Consultation Note  Patient Name: Megan Owens Unijb'd Date: 11/19/2024 Age:32 hours Reason for consult: Follow-up assessment;Early term 55-38.6wks  P2, Mother is experienced with breastfeeding and feels her breasts are filling.  Baby has had nasal congestion which mother states has improved.  She states he cluster fed last night.  Discharge pending respiratory evaluation.  Baby was fed recently at 0900. Mother was excited that she was able to pump 7 ml this morning.  Baby consumed an additional 15 ml of donor milk with Dr. Orlinda Ultra Preemie bottle and nipple.  Suggest calling for LC to view next feeding. Reviewed engorgement care and monitoring voids/stools. Reminded mother to pump or breastfeed a minimum 8 times per day to establish her milk supply.   Maternal Data Has patient been taught Hand Expression?: Yes  Feeding Mother's Current Feeding Choice: Breast Milk and Donor Milk Nipple Type: Dr. Jonna Fling Preemie  LATCH Score Latch: Grasps breast easily, tongue down, lips flanged, rhythmical sucking.  Audible Swallowing: A few with stimulation  Type of Nipple: Everted at rest and after stimulation  Comfort (Breast/Nipple): Soft / non-tender  Hold (Positioning): No assistance needed to correctly position infant at breast.  LATCH Score: 9   Lactation Tools Discussed/Used    Interventions Interventions: Education  Discharge Discharge Education: Engorgement and breast care;Warning signs for feeding baby Pump: Personal;Hands Free;DEBP  Consult Status Consult Status: Complete Date: 11/19/24   Shannon Dines Boschen  RN, IBCLC 11/19/2024, 10:21 AM

## 2024-11-19 NOTE — Discharge Summary (Signed)
 Postpartum Discharge Summary  Date of Service updated      Patient Name: Megan Owens DOB: Apr 06, 1991 MRN: [id number]  Date of admission: 11/16/2024 Delivery date:11/17/2024 Delivering provider: SUDIE LAVONIA HERO Date of discharge: 11/19/2024  Admitting diagnosis: Pregnant and not yet delivered in third trimester [Z34.93] Intrauterine pregnancy: [redacted]w[redacted]d     Secondary diagnosis:  Principal Problem:   Pregnant and not yet delivered in third trimester  Additional problems: polyhydramnious, SROM, gestational thrombocytopenia, LGA     Discharge diagnosis: Term Pregnancy Delivered                                              Post partum procedures:n/a Augmentation: Pitocin  Complications: None  Hospital course: Induction of Labor With Vaginal Delivery   33 y.o. yo [id number] at [redacted]w[redacted]d was admitted to the hospital 11/16/2024 for induction of labor.  Indication for induction: srom.  Patient had an labor course complicated byn/a Membrane Rupture Time/Date: 7:00 PM,11/13/2024  Delivery Method:Vaginal, Spontaneous Operative Delivery:N/A Episiotomy: None Lacerations:  None Details of delivery can be found in separate delivery note.  Patient had a postpartum course complicated byn/a. Patient is discharged home 11/19/2024.  Newborn Data: Birth date:11/17/2024 Birth time:11:26 AM Gender:Female Living status:Living Apgars:7 ,9  Weight:3870 g  Magnesium Sulfate received: No BMZ received: No Rhophylac:N/A MMR:N/A T-DaP:Given prenatally Flu: No RSV Vaccine received: No Transfusion:No Immunizations administered: Immunization History  Administered Date(s) Administered   PFIZER(Purple Top)SARS-COV-2 Vaccination 06/27/2020, 07/23/2020    Physical exam  Vitals:   11/18/24 0232 11/18/24 1305 11/18/24 2224 11/19/24 0542  BP: (!) 108/58 108/76 (!) 102/55 (!) 96/58  Pulse: 78 84 86 80  Resp: 17 18 18 18   Temp:  (!) 97.5 F (36.4 C) 98.2 F (36.8 C) 98.1 F (36.7 C)  TempSrc:  Oral  Oral Oral  SpO2: 100% 100% 100% 99%  Weight:      Height:       General: alert, cooperative, and no distress Lochia: appropriate Uterine Fundus: firm Incision: N/A DVT Evaluation: No evidence of DVT seen on physical exam. Labs: Lab Results  Component Value Date   WBC 12.4 (H) 11/18/2024   HGB 9.7 (L) 11/18/2024   HCT 28.5 (L) 11/18/2024   MCV 90.5 11/18/2024   PLT 119 (L) 11/18/2024      Latest Ref Rng & Units 05/15/2023    7:42 PM  CMP  Glucose 70 - 99 mg/dL 872   BUN 6 - 20 mg/dL 9   Creatinine 9.55 - 8.99 mg/dL 9.31   Sodium 864 - 854 mmol/L 136   Potassium 3.5 - 5.1 mmol/L 4.2   Chloride 98 - 111 mmol/L 104   CO2 22 - 32 mmol/L 23   Calcium 8.9 - 10.3 mg/dL 9.3    Edinburgh Score:    11/17/2024    9:13 PM  Edinburgh Postnatal Depression Scale Screening Tool  I have been able to laugh and see the funny side of things. 0  I have looked forward with enjoyment to things. 0  I have blamed myself unnecessarily when things went wrong. 0  I have been anxious or worried for no good reason. 0  I have felt scared or panicky for no good reason. 0  Things have been getting on top of me. 0  I have been so unhappy that I have had difficulty sleeping. 0  I  have felt sad or miserable. 0  I have been so unhappy that I have been crying. 0  The thought of harming myself has occurred to me. 0  Edinburgh Postnatal Depression Scale Total 0      After visit meds:  Allergies as of 11/19/2024   No Active Allergies      Medication List     STOP taking these medications    valACYclovir  500 MG tablet Commonly known as: VALTREX        TAKE these medications    Azstarys 26.1-5.2 MG Caps Generic drug: Serdexmethylphen-Dexmethylphen Take 1 capsule by mouth daily.   cyclobenzaprine  10 MG tablet Commonly known as: FLEXERIL  Take 1 tablet (10 mg total) by mouth 2 (two) times daily as needed for muscle spasms.   ibuprofen  600 MG tablet Commonly known as: ADVIL  Take 1 tablet  (600 mg total) by mouth every 6 (six) hours.   multivitamin-prenatal 27-0.8 MG Tabs tablet Take 1 tablet by mouth daily at 12 noon.   pantoprazole  40 MG tablet Commonly known as: PROTONIX  Take 1 tablet (40 mg total) by mouth 2 (two) times daily before a meal.         Discharge home in stable condition Infant Feeding: Bottle and Breast Infant Disposition:home with mother Discharge instruction: per After Visit Summary and Postpartum booklet. Activity: Advance as tolerated. Pelvic rest for 6 weeks.  Diet: routine diet Anticipated Birth Control: Unsure Postpartum Appointment:6 weeks Additional Postpartum F/U: Postpartum Depression checkup Future Appointments:No future appointments. Follow up Visit:  Follow-up Information     [facility]. Schedule an appointment as soon as possible for a visit.   Why: 6 weeks for postpartum visit Contact information: [address]  [address] [place] [address] [phone]                     11/19/2024 Ted LELON Solo, DO

## 2024-11-19 NOTE — Progress Notes (Signed)
 Post Partum Day 2 Subjective: no complaints, up ad lib, voiding, tolerating PO, + flatus, and lochia mild. She reports 2 BMS. Ambulating with no issues. Pain well controlled. Breast/bottlefeeding well. Denies dizziness, CP or SOB. Feels ready for discharge to home after baby circumcision   Objective: Blood pressure (!) 96/58, pulse 80, temperature 98.1 F (36.7 C), temperature source Oral, resp. rate 18, height 5' 5 (1.651 m), weight 80.7 kg, last menstrual period 02/21/2024, SpO2 99%, unknown if currently breastfeeding.  Physical Exam:  General: alert, cooperative, and no distress Lochia: appropriate Uterine Fundus: firm Incision: n/a DVT Evaluation: No evidence of DVT seen on physical exam.  Recent Labs    11/16/24 1720 11/18/24 0433  HGB 12.5 9.7*  HCT 36.8 28.5*    Assessment/Plan: 33yo [id number] female on PPD#2 s/p svd - stable  - Gestational thrombocytopenia - plts stable >100K, lochia mild. - Mild ABLA - asymptomatic - Postpartum care- discharge to home today Instructions reviewed . 6 wk pp visit planned   LOS: 3 days   Megan Owens W Tenessa Marsee, DO 11/19/2024, 2:44 PM

## 2024-11-19 NOTE — Lactation Note (Signed)
 This note was copied from a baby's chart. Lactation Consultation Note  Patient Name: Megan Owens Unijb'd Date: 11/19/2024 Age:32 hours Reason for consult: Follow-up assessment;Early term 37-38.6wks;Other (Comment) (swallowing precautions.)  P3 mom asked LC to show her how to feed in slide lying position for the baby. LC showed mom. Baby taking DBM well. Baby continues to make noises constantly. Mom very concerned. Mom doesn't want to feed the baby to much at a time and will not feed him more than 15 ml. Gave mom paper of how much to give baby for bottle feeding. Mom stated she was still putting baby to the breast. Read note from ST and stated not to BF/bottle feed longer than 30 min. LC reinforced that and explained in detail reasoning.  Mom holding bay and he was arching his back and head bent back a lot. Encouraged mom to support his head and not let it drop back like that. Mom stated the baby is wanting to look at her that why he is doing that. Encouraged mom to support head while he looks.  Encouraged mom to call for assistance or questions.  Maternal Data Does the patient have breastfeeding experience prior to this delivery?: Yes  Feeding Mother's Current Feeding Choice: Breast Milk and Donor Milk  LATCH Score Latch:  (LC didn't see latch)     Type of Nipple: Everted at rest and after stimulation     Hold (Positioning): No assistance needed to correctly position infant at breast.      Lactation Tools Discussed/Used    Interventions Interventions: DEBP;Pace feeding;Education;Position options;Support pillows  Discharge    Consult Status Consult Status: Follow-up Date: 11/19/24 Follow-up type: In-patient    [name] 11/19/2024, 4:07 AM

## 2024-11-19 NOTE — Discharge Instructions (Signed)
 Call office with any concerns [phone]

## 2024-11-21 ENCOUNTER — Inpatient Hospital Stay (HOSPITAL_COMMUNITY)

## 2024-11-21 ENCOUNTER — Inpatient Hospital Stay (HOSPITAL_COMMUNITY): Admission: RE | Admit: 2024-11-21 | Source: Home / Self Care | Admitting: Student

## 2024-11-24 ENCOUNTER — Telehealth (HOSPITAL_COMMUNITY): Payer: Self-pay | Admitting: *Deleted

## 2024-11-24 NOTE — Telephone Encounter (Signed)
 11/24/2024  Name: Megan Owens MRN: [id number] DOB: [birth year]  Reason for Call:  Transition of Care Hospital Discharge Call  Contact Status: Patient Contact Status: Message  Language assistant needed:          Follow-Up Questions:    Van Postnatal Depression Scale:  In the Past 7 Days:    PHQ2-9 Depression Scale:     Discharge Follow-up:    Post-discharge interventions: NA  Steva Tammy PEAK  11/24/2024 1935
